# Patient Record
Sex: Male | Born: 1960
Health system: Southern US, Community
[De-identification: ages and names within clinical notes are randomized; demographics above are authoritative.]

## PROBLEM LIST (undated history)

## (undated) DIAGNOSIS — R7303 Prediabetes: Secondary | ICD-10-CM

## (undated) DIAGNOSIS — E785 Hyperlipidemia, unspecified: Secondary | ICD-10-CM

## (undated) DIAGNOSIS — G8929 Other chronic pain: Secondary | ICD-10-CM

## (undated) DIAGNOSIS — G629 Polyneuropathy, unspecified: Secondary | ICD-10-CM

## (undated) DIAGNOSIS — K219 Gastro-esophageal reflux disease without esophagitis: Secondary | ICD-10-CM

## (undated) DIAGNOSIS — K449 Diaphragmatic hernia without obstruction or gangrene: Secondary | ICD-10-CM

## (undated) DIAGNOSIS — E781 Pure hyperglyceridemia: Secondary | ICD-10-CM

## (undated) DIAGNOSIS — M545 Low back pain, unspecified: Secondary | ICD-10-CM

## (undated) HISTORY — DX: Low back pain, unspecified: M54.50

## (undated) HISTORY — DX: Gastro-esophageal reflux disease without esophagitis: K21.9

## (undated) HISTORY — PX: ELBOW SURGERY: SHX618

## (undated) HISTORY — DX: Prediabetes: R73.03

## (undated) HISTORY — PX: OTHER SURGICAL HISTORY: SHX169

## (undated) HISTORY — DX: Diaphragmatic hernia without obstruction or gangrene: K44.9

## (undated) HISTORY — DX: Polyneuropathy, unspecified: G62.9

## (undated) HISTORY — DX: Hyperlipidemia, unspecified: E78.5

## (undated) HISTORY — DX: Pure hyperglyceridemia: E78.1

## (undated) HISTORY — PX: ESOPHAGOGASTRODUODENOSCOPY: SHX1529

## (undated) HISTORY — PX: SHOULDER SURGERY: SHX246

## (undated) HISTORY — DX: Other chronic pain: G89.29

---

## 2000-07-26 ENCOUNTER — Encounter: Payer: Self-pay | Admitting: Orthopedic Surgery

## 2000-07-26 ENCOUNTER — Encounter: Admission: RE | Admit: 2000-07-26 | Discharge: 2000-07-26 | Payer: Self-pay | Admitting: Orthopedic Surgery

## 2008-06-11 ENCOUNTER — Ambulatory Visit (HOSPITAL_COMMUNITY): Admission: RE | Admit: 2008-06-11 | Discharge: 2008-06-12 | Payer: Self-pay | Admitting: Specialist

## 2009-05-10 ENCOUNTER — Inpatient Hospital Stay (HOSPITAL_COMMUNITY): Admission: RE | Admit: 2009-05-10 | Discharge: 2009-05-12 | Payer: Self-pay | Admitting: Neurological Surgery

## 2009-06-09 ENCOUNTER — Encounter (INDEPENDENT_AMBULATORY_CARE_PROVIDER_SITE_OTHER): Payer: Self-pay | Admitting: Neurological Surgery

## 2009-06-09 ENCOUNTER — Ambulatory Visit: Payer: Self-pay | Admitting: Vascular Surgery

## 2009-06-09 ENCOUNTER — Ambulatory Visit: Admission: RE | Admit: 2009-06-09 | Discharge: 2009-06-09 | Payer: Self-pay | Admitting: Neurological Surgery

## 2009-08-06 ENCOUNTER — Ambulatory Visit (HOSPITAL_COMMUNITY): Admission: RE | Admit: 2009-08-06 | Discharge: 2009-08-06 | Payer: Self-pay | Admitting: Neurological Surgery

## 2010-02-22 ENCOUNTER — Encounter: Admission: RE | Admit: 2010-02-22 | Discharge: 2010-02-22 | Payer: Self-pay | Admitting: Orthopaedic Surgery

## 2010-06-16 LAB — CBC
MCHC: 35.6 g/dL (ref 30.0–36.0)
Platelets: 251 10*3/uL (ref 150–400)
RBC: 4.93 MIL/uL (ref 4.22–5.81)
WBC: 6.6 10*3/uL (ref 4.0–10.5)

## 2010-06-16 LAB — TYPE AND SCREEN

## 2010-06-16 LAB — BASIC METABOLIC PANEL
CO2: 27 mEq/L (ref 19–32)
Calcium: 9.9 mg/dL (ref 8.4–10.5)
Creatinine, Ser: 0.86 mg/dL (ref 0.4–1.5)
Glucose, Bld: 127 mg/dL — ABNORMAL HIGH (ref 70–99)

## 2010-06-16 LAB — ABO/RH: ABO/RH(D): B POS

## 2010-07-07 LAB — COMPREHENSIVE METABOLIC PANEL
ALT: 44 U/L (ref 0–53)
AST: 32 U/L (ref 0–37)
Albumin: 4.2 g/dL (ref 3.5–5.2)
Alkaline Phosphatase: 53 U/L (ref 39–117)
BUN: 13 mg/dL (ref 6–23)
GFR calc Af Amer: >60 mL/min (ref >60)
GFR calc non Af Amer: >60 mL/min (ref >60)
Glucose, Bld: 112 mg/dL — ABNORMAL HIGH (ref 70–99)
Potassium: 4.9 mEq/L (ref 3.5–5.1)
Sodium: 144 mEq/L (ref 135–145)
Total Bilirubin: 0.9 mg/dL (ref 0.3–1.2)
Total Protein: 7.1 g/dL (ref 6.0–8.3)

## 2010-07-07 LAB — CBC
MCHC: 34.3 g/dL (ref 30.0–36.0)
RBC: 5.02 MIL/uL (ref 4.22–5.81)
RDW: 12.6 % (ref 11.5–15.5)
WBC: 7.2 10*3/uL (ref 4.0–10.5)

## 2010-07-07 LAB — URINALYSIS, ROUTINE W REFLEX MICROSCOPIC
Hgb urine dipstick: NEGATIVE
Specific Gravity, Urine: 1.021 (ref 1.005–1.030)
pH: 7.5 (ref 5.0–8.0)

## 2010-07-07 LAB — URINE MICROSCOPIC-ADD ON

## 2010-08-09 NOTE — Op Note (Signed)
Brian Doyle, Brian Doyle                ACCOUNT NO.:  [id number]   MEDICAL RECORD NO.:  [id number]          PATIENT TYPE:  AMB   LOCATION:  DAY                          FACILITY:  [facility]   PHYSICIAN:  Jene Every, M.D.    DATE OF BIRTH:  [birth year]   DATE OF PROCEDURE:  06/11/2008  DATE OF DISCHARGE:                               OPERATIVE REPORT   PREOPERATIVE DIAGNOSES:  Spinal stenosis with herniated nucleus pulposus  L4-5 left.   POSTOPERATIVE DIAGNOSES:  Spinal stenosis with herniated nucleus  pulposus L4-5 left.   PROCEDURE PERFORMED:  1. Lateral recess decompression L4-5.  2. Foraminotomy of L5.  3. Microdiskectomy L4-5.   ANESTHESIA:  General.   ASSISTANT:  Roma Schanz, P.A.   BRIEF HISTORY:  A 50 year old with left lower extremity radicular pain  in the L5 nerve root distribution, lateral recess stenosis, positive  neurotension signs, temporary relief with corticosteroid injection.  MRI  indicating paracentral disk protrusion, lateral recess stenosis, disk  degeneration noted as well, predominant leg pain is indicated for  decompression of the 5 root.  The risks and benefits discussed,  including infection, damage to neurovascular structures, CSF leakage,  epidural fibrosis __________need for fusion in the future and anesthetic  complications.   DESCRIPTION OF PROCEDURE:  The patient was placed in the supine position  after induction of adequate anesthesia and 1 gram of Kefzol, placed  prone on the [place] frame.  All bony prominences were well padded.  The  lumbar region prepped and draped in the usual sterile fashion.  Two 18  gauge spinal needles were utilized to localize the 4-5 interspace and  confirmed with x-ray.  Incision was made from the spinous process of 4-  5.  Subcutaneous tissue was dissected.  Electrocautery was utilized to  achieve hemostasis.  A McCullough retractor was placed.  The operating  microscope was draped and brought in the surgical  field.  Confirmatory  radiograph obtained with a Penfield IV in the interlaminar space.  Ligamentum flavum detached from the cephalad edge of 5 with a straight  curette.  Foraminotomy of 5 was performed.  Severe lateral recess  stenosis was noted.  I gently mobilized the 5 root medially and  decompressed the lateral recess to the medial border of the pedicle.  Multifactorial ligamentum flavum hypertrophy.  Epidural venous plexus  was noted.  Electrocautery was utilized to achieve hemostasis.  Focal  HNP was noted.  Annulotomy performed.  A copious portion of disk  material was removed from the disk space with straight and upbiting  pituitary and further mobilized from the subannular space with a nerve  hook.  Following complete removal of herniated material we explored.  There was at least 1 cm of  __________ in the 5 root near the pedicle  without tension.  Hockey-stick probe passed freely out the foramen of 4-  5.  There was no disk herniation beneath the thecal sac, axilla or the  shoulder or the root.  The disk space was copiously irrigated with  antibiotic irrigation.  Thorough inspection revealed no evidence of CSF  leakage  or active bleeding.  Epidural fat was draped over the 5 nerve  root.  Next, McCullough retractors removed, irrigated the muscles with  no active bleeding.  The dorsolumbar fascia reapproximated with 1 Vicryl  interrupted figure-of-eight sutures and subcu with 2-0 Vicryl simple  sutures.  The skin was reapproximated with 4-0 subcuticular Prolene.  The wound reinforced with Steri-  Strips.  Sterile dressing applied.  Placed supine on hospital bed,  extubated without difficulty and transported to the recovery room in  satisfactory condition.   The patient tolerated the procedure well with no complications.      Jene Every, M.D.  Electronically Signed     JB/MEDQ  D:  06/11/2008  T:  06/11/2008  Job:  [id number]

## 2011-08-21 IMAGING — CT CT L SPINE W/O CM
4 of 10 series · 12 of 33 positions shown, 14 images · non-contrast
Comparison: CT of the lumbar spine without contrast 08/06/2009.

CLINICAL DATA: Low back and right leg pain.  Status post fusion.

CT LUMBAR SPINE WITHOUT CONTRAST
TECHNIQUE: Multidetector CT imaging of the lumbar spine was
performed without intravenous contrast administration. Multiplanar
CT image reconstructions were also generated.

[Series 2: l spine bone · axial · 0.27mm/px · z∈[+50,+130]mm · 2 of 97 slices shown, 3 images]
[im 33/97  soft-tissue]
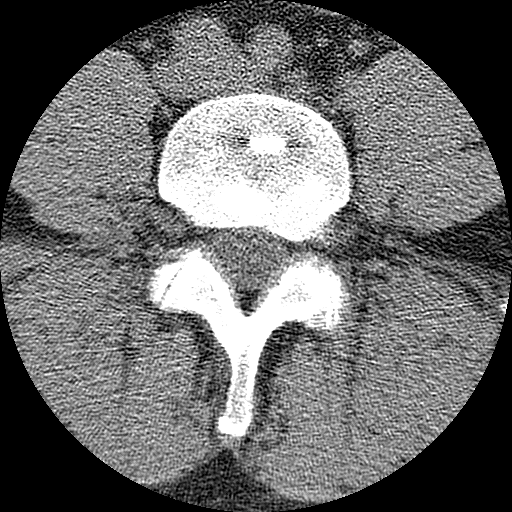
[im 33/97  bone]
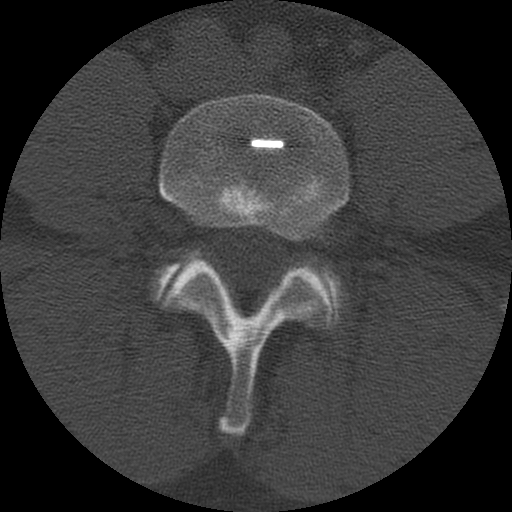
[im 65/97  bone]
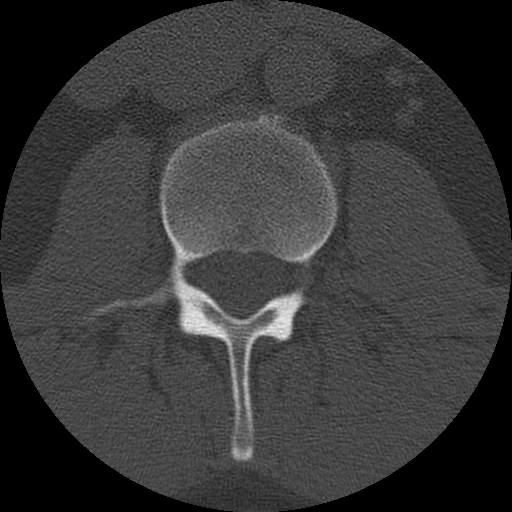

[Series 3: l spine soft · axial · 0.27mm/px · z∈[+50,+130]mm · 2 of 97 slices shown]
[im 33/97  soft-tissue]
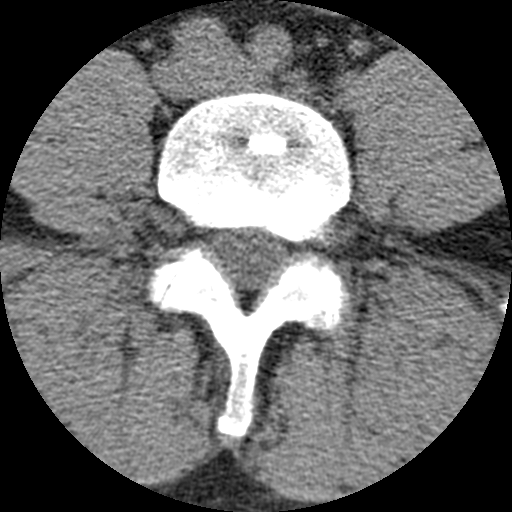
[im 65/97  soft-tissue]
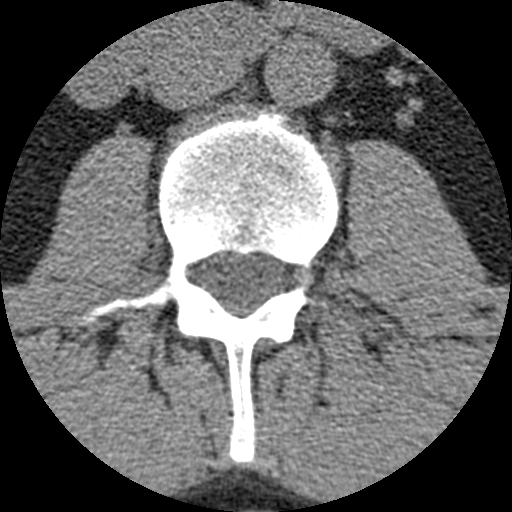

[Series 400: cor · coronal · 0.48mm/px · 3 of 50 slices shown]
[im 10/50  bone]
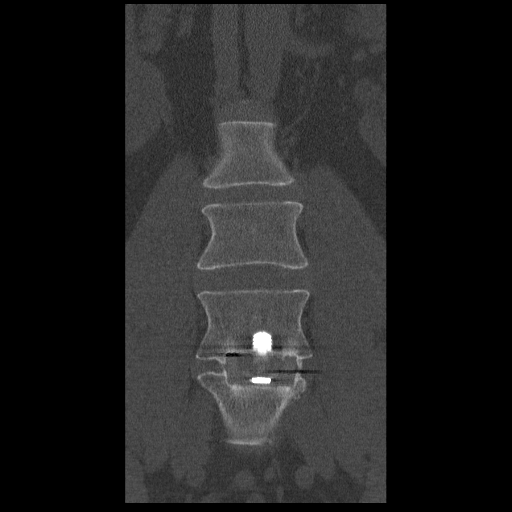
[im 20/50  bone]
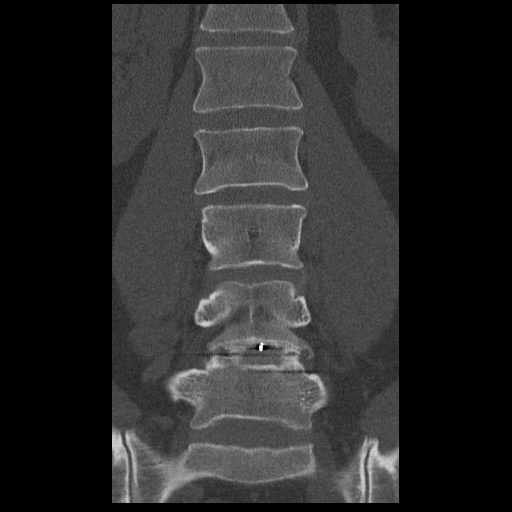
[im 30/50  bone]
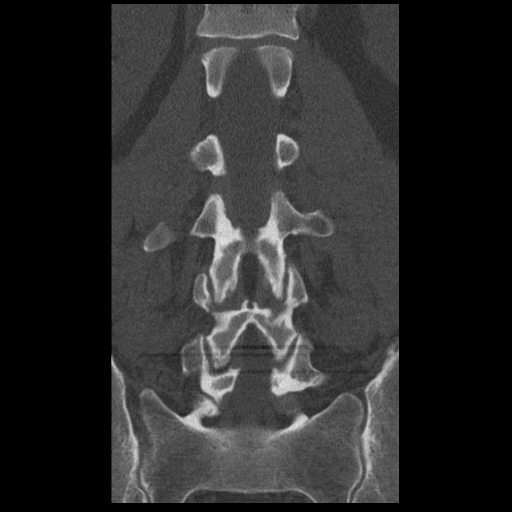

[Series 401: sag · sagittal · 0.48mm/px · 5 of 50 slices shown, 6 images]
[im 17/50  bone]
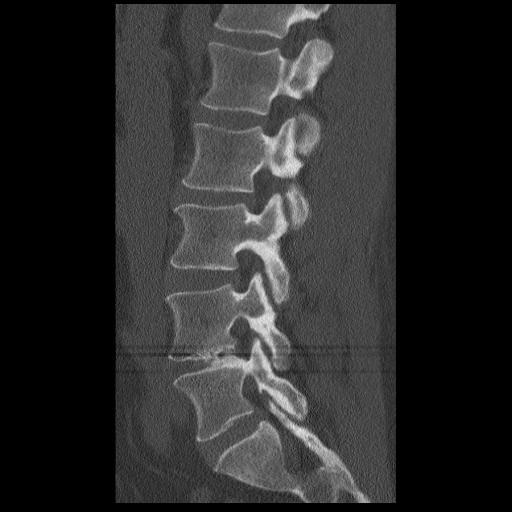
[im 21/50  bone]
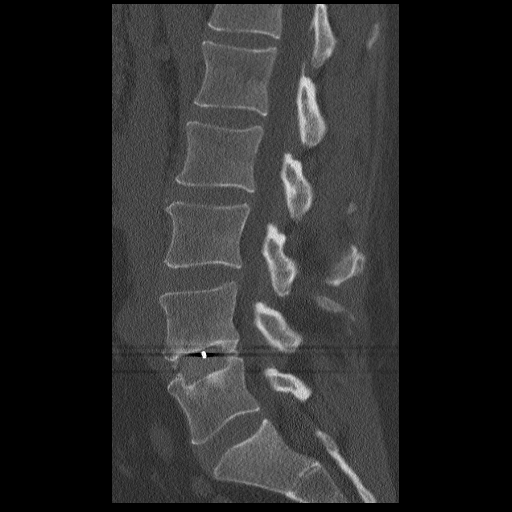
[im 25/50  soft-tissue]
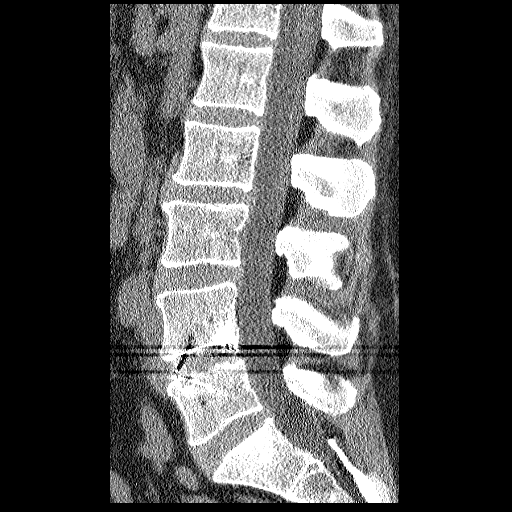
[im 25/50  bone]
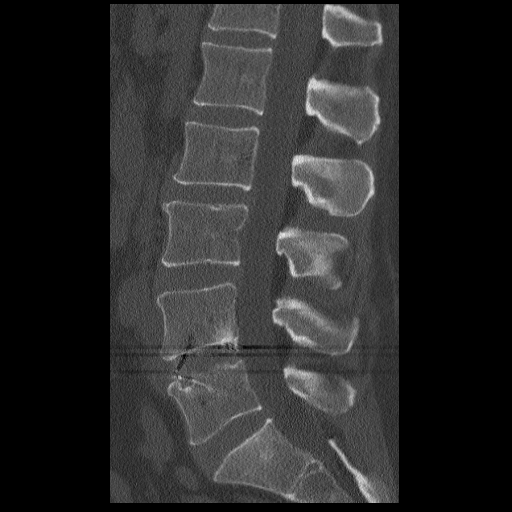
[im 29/50  bone]
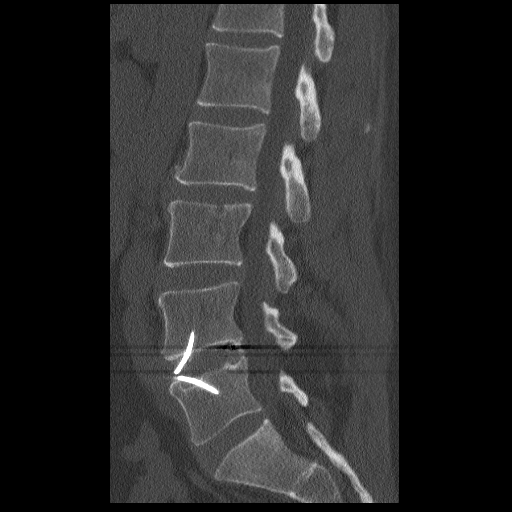
[im 33/50  bone]
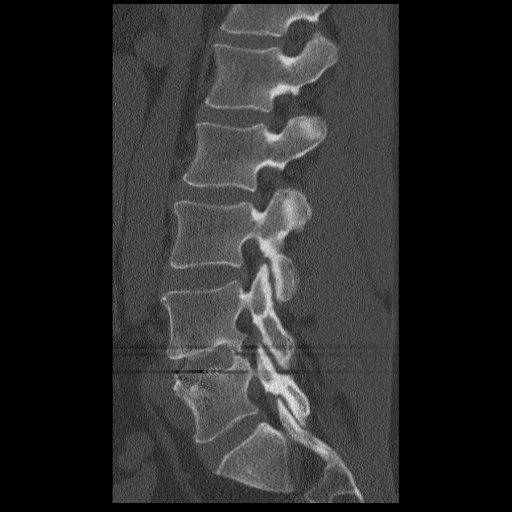

[12 of 33 positions shown; findings below may reference images not displayed]

FINDINGS: The lumbar spine is imaged from midbody of T12 through
S2.  The patient is status post anterior fusion at L4-5.  There is
progressive bone growth since the prior study.  There is some beam
hardening artifact.  However, there does appear to be at least one
area of bridging bone posteriorly on the right.  There is no gas in
the disc space.  The hardware is intact.  Mild disc bulging at L3-4
and L5-S1 is stable.  Mild facet hypertrophy at L5-S1 is unchanged.
IMPRESSION: 1.  Progressive bone growth at the L4-5 disc level following
anterior fusion.
2.  There may be at least one area of focal bridging bone
posteriorly on the right.  No other definite bridging bone is seen
at this point.
3.  Stable mild disc bulging at L3-4 and L5-S1.

## 2013-10-16 HISTORY — PX: COLONOSCOPY: SHX174

## 2014-04-01 DIAGNOSIS — M5442 Lumbago with sciatica, left side: Secondary | ICD-10-CM | POA: Diagnosis not present

## 2014-04-03 DIAGNOSIS — M5442 Lumbago with sciatica, left side: Secondary | ICD-10-CM | POA: Diagnosis not present

## 2014-04-06 DIAGNOSIS — M5442 Lumbago with sciatica, left side: Secondary | ICD-10-CM | POA: Diagnosis not present

## 2014-04-10 DIAGNOSIS — M5442 Lumbago with sciatica, left side: Secondary | ICD-10-CM | POA: Diagnosis not present

## 2014-04-14 DIAGNOSIS — M5442 Lumbago with sciatica, left side: Secondary | ICD-10-CM | POA: Diagnosis not present

## 2014-04-24 DIAGNOSIS — M5442 Lumbago with sciatica, left side: Secondary | ICD-10-CM | POA: Diagnosis not present

## 2014-04-29 DIAGNOSIS — M5442 Lumbago with sciatica, left side: Secondary | ICD-10-CM | POA: Diagnosis not present

## 2014-05-01 DIAGNOSIS — M5442 Lumbago with sciatica, left side: Secondary | ICD-10-CM | POA: Diagnosis not present

## 2014-05-12 DIAGNOSIS — M7712 Lateral epicondylitis, left elbow: Secondary | ICD-10-CM | POA: Diagnosis not present

## 2014-05-21 DIAGNOSIS — E780 Pure hypercholesterolemia: Secondary | ICD-10-CM | POA: Diagnosis not present

## 2014-05-21 DIAGNOSIS — K219 Gastro-esophageal reflux disease without esophagitis: Secondary | ICD-10-CM | POA: Diagnosis not present

## 2014-05-22 DIAGNOSIS — E780 Pure hypercholesterolemia: Secondary | ICD-10-CM | POA: Diagnosis not present

## 2014-06-01 DIAGNOSIS — E780 Pure hypercholesterolemia: Secondary | ICD-10-CM | POA: Diagnosis not present

## 2014-06-01 DIAGNOSIS — K219 Gastro-esophageal reflux disease without esophagitis: Secondary | ICD-10-CM | POA: Diagnosis not present

## 2014-06-01 DIAGNOSIS — M545 Low back pain: Secondary | ICD-10-CM | POA: Diagnosis not present

## 2014-06-05 DIAGNOSIS — M7712 Lateral epicondylitis, left elbow: Secondary | ICD-10-CM | POA: Diagnosis not present

## 2014-06-05 DIAGNOSIS — N289 Disorder of kidney and ureter, unspecified: Secondary | ICD-10-CM | POA: Diagnosis not present

## 2014-06-05 DIAGNOSIS — Z885 Allergy status to narcotic agent status: Secondary | ICD-10-CM | POA: Diagnosis not present

## 2014-06-05 DIAGNOSIS — M549 Dorsalgia, unspecified: Secondary | ICD-10-CM | POA: Diagnosis not present

## 2014-06-05 DIAGNOSIS — Z9852 Vasectomy status: Secondary | ICD-10-CM | POA: Diagnosis not present

## 2014-06-05 DIAGNOSIS — E669 Obesity, unspecified: Secondary | ICD-10-CM | POA: Diagnosis not present

## 2014-06-05 DIAGNOSIS — Z6834 Body mass index (BMI) 34.0-34.9, adult: Secondary | ICD-10-CM | POA: Diagnosis not present

## 2014-06-05 DIAGNOSIS — Z888 Allergy status to other drugs, medicaments and biological substances status: Secondary | ICD-10-CM | POA: Diagnosis not present

## 2014-06-05 DIAGNOSIS — K219 Gastro-esophageal reflux disease without esophagitis: Secondary | ICD-10-CM | POA: Diagnosis not present

## 2014-06-05 DIAGNOSIS — Z79899 Other long term (current) drug therapy: Secondary | ICD-10-CM | POA: Diagnosis not present

## 2014-06-05 DIAGNOSIS — Z801 Family history of malignant neoplasm of trachea, bronchus and lung: Secondary | ICD-10-CM | POA: Diagnosis not present

## 2014-09-03 DIAGNOSIS — M5430 Sciatica, unspecified side: Secondary | ICD-10-CM | POA: Diagnosis not present

## 2014-09-03 DIAGNOSIS — K219 Gastro-esophageal reflux disease without esophagitis: Secondary | ICD-10-CM | POA: Diagnosis not present

## 2014-09-03 DIAGNOSIS — E780 Pure hypercholesterolemia: Secondary | ICD-10-CM | POA: Diagnosis not present

## 2014-09-03 DIAGNOSIS — R202 Paresthesia of skin: Secondary | ICD-10-CM | POA: Diagnosis not present

## 2014-09-03 DIAGNOSIS — M545 Low back pain: Secondary | ICD-10-CM | POA: Diagnosis not present

## 2014-09-29 DIAGNOSIS — M7712 Lateral epicondylitis, left elbow: Secondary | ICD-10-CM | POA: Diagnosis not present

## 2014-09-29 DIAGNOSIS — M25512 Pain in left shoulder: Secondary | ICD-10-CM | POA: Diagnosis not present

## 2014-10-26 DIAGNOSIS — M7712 Lateral epicondylitis, left elbow: Secondary | ICD-10-CM | POA: Diagnosis not present

## 2014-10-26 DIAGNOSIS — M7552 Bursitis of left shoulder: Secondary | ICD-10-CM | POA: Diagnosis not present

## 2014-12-01 DIAGNOSIS — E780 Pure hypercholesterolemia: Secondary | ICD-10-CM | POA: Diagnosis not present

## 2014-12-01 DIAGNOSIS — K219 Gastro-esophageal reflux disease without esophagitis: Secondary | ICD-10-CM | POA: Diagnosis not present

## 2014-12-07 DIAGNOSIS — M5430 Sciatica, unspecified side: Secondary | ICD-10-CM | POA: Diagnosis not present

## 2014-12-07 DIAGNOSIS — M545 Low back pain: Secondary | ICD-10-CM | POA: Diagnosis not present

## 2014-12-07 DIAGNOSIS — K219 Gastro-esophageal reflux disease without esophagitis: Secondary | ICD-10-CM | POA: Diagnosis not present

## 2014-12-07 DIAGNOSIS — L918 Other hypertrophic disorders of the skin: Secondary | ICD-10-CM | POA: Diagnosis not present

## 2015-03-05 DIAGNOSIS — M545 Low back pain: Secondary | ICD-10-CM | POA: Diagnosis not present

## 2015-03-05 DIAGNOSIS — Z13220 Encounter for screening for lipoid disorders: Secondary | ICD-10-CM | POA: Diagnosis not present

## 2015-03-05 DIAGNOSIS — R202 Paresthesia of skin: Secondary | ICD-10-CM | POA: Diagnosis not present

## 2015-03-05 DIAGNOSIS — M5430 Sciatica, unspecified side: Secondary | ICD-10-CM | POA: Diagnosis not present

## 2015-03-05 DIAGNOSIS — K219 Gastro-esophageal reflux disease without esophagitis: Secondary | ICD-10-CM | POA: Diagnosis not present

## 2015-06-13 DIAGNOSIS — N201 Calculus of ureter: Secondary | ICD-10-CM | POA: Diagnosis not present

## 2015-06-13 DIAGNOSIS — K5790 Diverticulosis of intestine, part unspecified, without perforation or abscess without bleeding: Secondary | ICD-10-CM | POA: Diagnosis not present

## 2015-06-13 DIAGNOSIS — Z79899 Other long term (current) drug therapy: Secondary | ICD-10-CM | POA: Diagnosis not present

## 2015-06-13 DIAGNOSIS — R1032 Left lower quadrant pain: Secondary | ICD-10-CM | POA: Diagnosis not present

## 2015-06-13 DIAGNOSIS — M545 Low back pain: Secondary | ICD-10-CM | POA: Diagnosis not present

## 2015-06-13 DIAGNOSIS — K760 Fatty (change of) liver, not elsewhere classified: Secondary | ICD-10-CM | POA: Diagnosis not present

## 2015-06-13 DIAGNOSIS — K769 Liver disease, unspecified: Secondary | ICD-10-CM | POA: Diagnosis not present

## 2015-06-13 DIAGNOSIS — E785 Hyperlipidemia, unspecified: Secondary | ICD-10-CM | POA: Diagnosis not present

## 2015-06-13 DIAGNOSIS — Z87442 Personal history of urinary calculi: Secondary | ICD-10-CM | POA: Diagnosis not present

## 2015-06-13 DIAGNOSIS — K219 Gastro-esophageal reflux disease without esophagitis: Secondary | ICD-10-CM | POA: Diagnosis not present

## 2015-06-13 DIAGNOSIS — N132 Hydronephrosis with renal and ureteral calculous obstruction: Secondary | ICD-10-CM | POA: Diagnosis not present

## 2015-06-21 DIAGNOSIS — E7800 Pure hypercholesterolemia, unspecified: Secondary | ICD-10-CM | POA: Diagnosis not present

## 2015-06-21 DIAGNOSIS — K219 Gastro-esophageal reflux disease without esophagitis: Secondary | ICD-10-CM | POA: Diagnosis not present

## 2015-06-21 DIAGNOSIS — N200 Calculus of kidney: Secondary | ICD-10-CM | POA: Diagnosis not present

## 2015-06-21 DIAGNOSIS — L918 Other hypertrophic disorders of the skin: Secondary | ICD-10-CM | POA: Diagnosis not present

## 2015-06-21 DIAGNOSIS — Z72820 Sleep deprivation: Secondary | ICD-10-CM | POA: Diagnosis not present

## 2015-06-21 DIAGNOSIS — R202 Paresthesia of skin: Secondary | ICD-10-CM | POA: Diagnosis not present

## 2015-06-21 DIAGNOSIS — F1190 Opioid use, unspecified, uncomplicated: Secondary | ICD-10-CM | POA: Diagnosis not present

## 2015-06-21 DIAGNOSIS — M5432 Sciatica, left side: Secondary | ICD-10-CM | POA: Diagnosis not present

## 2015-06-21 DIAGNOSIS — N4281 Prostatodynia syndrome: Secondary | ICD-10-CM | POA: Diagnosis not present

## 2015-06-23 DIAGNOSIS — K219 Gastro-esophageal reflux disease without esophagitis: Secondary | ICD-10-CM | POA: Diagnosis not present

## 2015-06-23 DIAGNOSIS — N200 Calculus of kidney: Secondary | ICD-10-CM | POA: Diagnosis not present

## 2015-06-23 DIAGNOSIS — M545 Low back pain: Secondary | ICD-10-CM | POA: Diagnosis not present

## 2015-06-23 DIAGNOSIS — Z13220 Encounter for screening for lipoid disorders: Secondary | ICD-10-CM | POA: Diagnosis not present

## 2015-06-23 DIAGNOSIS — M5432 Sciatica, left side: Secondary | ICD-10-CM | POA: Diagnosis not present

## 2015-07-02 DIAGNOSIS — K760 Fatty (change of) liver, not elsewhere classified: Secondary | ICD-10-CM | POA: Diagnosis not present

## 2015-07-02 DIAGNOSIS — N281 Cyst of kidney, acquired: Secondary | ICD-10-CM | POA: Diagnosis not present

## 2015-07-02 DIAGNOSIS — D376 Neoplasm of uncertain behavior of liver, gallbladder and bile ducts: Secondary | ICD-10-CM | POA: Diagnosis not present

## 2015-08-10 DIAGNOSIS — R8299 Other abnormal findings in urine: Secondary | ICD-10-CM | POA: Diagnosis not present

## 2015-08-10 DIAGNOSIS — N200 Calculus of kidney: Secondary | ICD-10-CM | POA: Diagnosis not present

## 2015-09-17 DIAGNOSIS — Z1389 Encounter for screening for other disorder: Secondary | ICD-10-CM | POA: Diagnosis not present

## 2015-09-17 DIAGNOSIS — K219 Gastro-esophageal reflux disease without esophagitis: Secondary | ICD-10-CM | POA: Diagnosis not present

## 2015-09-17 DIAGNOSIS — E7800 Pure hypercholesterolemia, unspecified: Secondary | ICD-10-CM | POA: Diagnosis not present

## 2015-09-17 DIAGNOSIS — M545 Low back pain: Secondary | ICD-10-CM | POA: Diagnosis not present

## 2015-09-17 DIAGNOSIS — R202 Paresthesia of skin: Secondary | ICD-10-CM | POA: Diagnosis not present

## 2015-09-17 DIAGNOSIS — M5432 Sciatica, left side: Secondary | ICD-10-CM | POA: Diagnosis not present

## 2015-11-25 DIAGNOSIS — Z0000 Encounter for general adult medical examination without abnormal findings: Secondary | ICD-10-CM | POA: Diagnosis not present

## 2015-11-25 DIAGNOSIS — E7800 Pure hypercholesterolemia, unspecified: Secondary | ICD-10-CM | POA: Diagnosis not present

## 2015-11-25 DIAGNOSIS — K219 Gastro-esophageal reflux disease without esophagitis: Secondary | ICD-10-CM | POA: Diagnosis not present

## 2016-01-08 DIAGNOSIS — Z0001 Encounter for general adult medical examination with abnormal findings: Secondary | ICD-10-CM | POA: Diagnosis not present

## 2016-01-08 DIAGNOSIS — M5432 Sciatica, left side: Secondary | ICD-10-CM | POA: Diagnosis not present

## 2016-01-19 DIAGNOSIS — M545 Low back pain: Secondary | ICD-10-CM | POA: Diagnosis not present

## 2016-02-14 DIAGNOSIS — M79672 Pain in left foot: Secondary | ICD-10-CM | POA: Diagnosis not present

## 2016-02-14 DIAGNOSIS — G8929 Other chronic pain: Secondary | ICD-10-CM | POA: Diagnosis not present

## 2016-02-14 DIAGNOSIS — Z79899 Other long term (current) drug therapy: Secondary | ICD-10-CM | POA: Diagnosis not present

## 2016-02-29 DIAGNOSIS — M47816 Spondylosis without myelopathy or radiculopathy, lumbar region: Secondary | ICD-10-CM | POA: Diagnosis not present

## 2016-02-29 DIAGNOSIS — Z981 Arthrodesis status: Secondary | ICD-10-CM | POA: Diagnosis not present

## 2016-03-08 DIAGNOSIS — K645 Perianal venous thrombosis: Secondary | ICD-10-CM | POA: Diagnosis not present

## 2016-03-13 DIAGNOSIS — M5136 Other intervertebral disc degeneration, lumbar region: Secondary | ICD-10-CM | POA: Diagnosis not present

## 2016-03-13 DIAGNOSIS — Z79899 Other long term (current) drug therapy: Secondary | ICD-10-CM | POA: Diagnosis not present

## 2016-03-13 DIAGNOSIS — G8929 Other chronic pain: Secondary | ICD-10-CM | POA: Diagnosis not present

## 2016-03-13 DIAGNOSIS — M47816 Spondylosis without myelopathy or radiculopathy, lumbar region: Secondary | ICD-10-CM | POA: Diagnosis not present

## 2016-04-04 DIAGNOSIS — Z72820 Sleep deprivation: Secondary | ICD-10-CM | POA: Diagnosis not present

## 2016-04-04 DIAGNOSIS — K219 Gastro-esophageal reflux disease without esophagitis: Secondary | ICD-10-CM | POA: Diagnosis not present

## 2016-04-04 DIAGNOSIS — M5430 Sciatica, unspecified side: Secondary | ICD-10-CM | POA: Diagnosis not present

## 2016-04-04 DIAGNOSIS — M545 Low back pain: Secondary | ICD-10-CM | POA: Diagnosis not present

## 2016-05-30 DIAGNOSIS — M961 Postlaminectomy syndrome, not elsewhere classified: Secondary | ICD-10-CM | POA: Diagnosis not present

## 2016-08-29 DIAGNOSIS — G894 Chronic pain syndrome: Secondary | ICD-10-CM | POA: Diagnosis not present

## 2016-08-29 DIAGNOSIS — M961 Postlaminectomy syndrome, not elsewhere classified: Secondary | ICD-10-CM | POA: Diagnosis not present

## 2016-11-09 DIAGNOSIS — L918 Other hypertrophic disorders of the skin: Secondary | ICD-10-CM | POA: Diagnosis not present

## 2016-11-09 DIAGNOSIS — E7800 Pure hypercholesterolemia, unspecified: Secondary | ICD-10-CM | POA: Diagnosis not present

## 2016-11-09 DIAGNOSIS — K219 Gastro-esophageal reflux disease without esophagitis: Secondary | ICD-10-CM | POA: Diagnosis not present

## 2016-11-14 DIAGNOSIS — M5430 Sciatica, unspecified side: Secondary | ICD-10-CM | POA: Diagnosis not present

## 2016-11-14 DIAGNOSIS — Z1389 Encounter for screening for other disorder: Secondary | ICD-10-CM | POA: Diagnosis not present

## 2016-11-14 DIAGNOSIS — M545 Low back pain: Secondary | ICD-10-CM | POA: Diagnosis not present

## 2016-11-14 DIAGNOSIS — Z72820 Sleep deprivation: Secondary | ICD-10-CM | POA: Diagnosis not present

## 2016-11-28 DIAGNOSIS — M961 Postlaminectomy syndrome, not elsewhere classified: Secondary | ICD-10-CM | POA: Diagnosis not present

## 2016-11-28 DIAGNOSIS — M542 Cervicalgia: Secondary | ICD-10-CM | POA: Diagnosis not present

## 2016-11-28 DIAGNOSIS — M5413 Radiculopathy, cervicothoracic region: Secondary | ICD-10-CM | POA: Diagnosis not present

## 2017-01-04 DIAGNOSIS — M542 Cervicalgia: Secondary | ICD-10-CM | POA: Diagnosis not present

## 2017-01-04 DIAGNOSIS — M5413 Radiculopathy, cervicothoracic region: Secondary | ICD-10-CM | POA: Diagnosis not present

## 2017-01-15 DIAGNOSIS — M542 Cervicalgia: Secondary | ICD-10-CM | POA: Diagnosis not present

## 2017-01-15 DIAGNOSIS — M5413 Radiculopathy, cervicothoracic region: Secondary | ICD-10-CM | POA: Diagnosis not present

## 2017-02-27 DIAGNOSIS — M961 Postlaminectomy syndrome, not elsewhere classified: Secondary | ICD-10-CM | POA: Diagnosis not present

## 2017-05-09 DIAGNOSIS — E7800 Pure hypercholesterolemia, unspecified: Secondary | ICD-10-CM | POA: Diagnosis not present

## 2017-05-16 DIAGNOSIS — E7800 Pure hypercholesterolemia, unspecified: Secondary | ICD-10-CM | POA: Diagnosis not present

## 2017-05-16 DIAGNOSIS — Z0001 Encounter for general adult medical examination with abnormal findings: Secondary | ICD-10-CM | POA: Diagnosis not present

## 2017-05-16 DIAGNOSIS — M545 Low back pain: Secondary | ICD-10-CM | POA: Diagnosis not present

## 2017-05-16 DIAGNOSIS — Z72820 Sleep deprivation: Secondary | ICD-10-CM | POA: Diagnosis not present

## 2017-05-16 DIAGNOSIS — K219 Gastro-esophageal reflux disease without esophagitis: Secondary | ICD-10-CM | POA: Diagnosis not present

## 2017-05-28 DIAGNOSIS — M5413 Radiculopathy, cervicothoracic region: Secondary | ICD-10-CM | POA: Diagnosis not present

## 2017-05-28 DIAGNOSIS — R030 Elevated blood-pressure reading, without diagnosis of hypertension: Secondary | ICD-10-CM | POA: Diagnosis not present

## 2017-05-28 DIAGNOSIS — M961 Postlaminectomy syndrome, not elsewhere classified: Secondary | ICD-10-CM | POA: Diagnosis not present

## 2017-05-28 DIAGNOSIS — M542 Cervicalgia: Secondary | ICD-10-CM | POA: Diagnosis not present

## 2017-08-27 DIAGNOSIS — M542 Cervicalgia: Secondary | ICD-10-CM | POA: Diagnosis not present

## 2017-08-27 DIAGNOSIS — R030 Elevated blood-pressure reading, without diagnosis of hypertension: Secondary | ICD-10-CM | POA: Diagnosis not present

## 2017-08-27 DIAGNOSIS — M961 Postlaminectomy syndrome, not elsewhere classified: Secondary | ICD-10-CM | POA: Diagnosis not present

## 2017-09-13 DIAGNOSIS — L918 Other hypertrophic disorders of the skin: Secondary | ICD-10-CM | POA: Diagnosis not present

## 2017-09-13 DIAGNOSIS — K219 Gastro-esophageal reflux disease without esophagitis: Secondary | ICD-10-CM | POA: Diagnosis not present

## 2017-09-13 DIAGNOSIS — E7800 Pure hypercholesterolemia, unspecified: Secondary | ICD-10-CM | POA: Diagnosis not present

## 2017-09-19 DIAGNOSIS — K219 Gastro-esophageal reflux disease without esophagitis: Secondary | ICD-10-CM | POA: Diagnosis not present

## 2017-09-19 DIAGNOSIS — M545 Low back pain: Secondary | ICD-10-CM | POA: Diagnosis not present

## 2017-09-19 DIAGNOSIS — E782 Mixed hyperlipidemia: Secondary | ICD-10-CM | POA: Diagnosis not present

## 2017-09-19 DIAGNOSIS — Z72820 Sleep deprivation: Secondary | ICD-10-CM | POA: Diagnosis not present

## 2017-11-22 DIAGNOSIS — R030 Elevated blood-pressure reading, without diagnosis of hypertension: Secondary | ICD-10-CM | POA: Diagnosis not present

## 2017-11-22 DIAGNOSIS — M961 Postlaminectomy syndrome, not elsewhere classified: Secondary | ICD-10-CM | POA: Diagnosis not present

## 2017-11-22 DIAGNOSIS — M542 Cervicalgia: Secondary | ICD-10-CM | POA: Diagnosis not present

## 2017-11-22 DIAGNOSIS — M5413 Radiculopathy, cervicothoracic region: Secondary | ICD-10-CM | POA: Diagnosis not present

## 2017-12-26 DIAGNOSIS — Z79899 Other long term (current) drug therapy: Secondary | ICD-10-CM | POA: Diagnosis not present

## 2017-12-26 DIAGNOSIS — Z888 Allergy status to other drugs, medicaments and biological substances status: Secondary | ICD-10-CM | POA: Diagnosis not present

## 2017-12-26 DIAGNOSIS — N200 Calculus of kidney: Secondary | ICD-10-CM | POA: Diagnosis not present

## 2017-12-26 DIAGNOSIS — K5730 Diverticulosis of large intestine without perforation or abscess without bleeding: Secondary | ICD-10-CM | POA: Diagnosis not present

## 2017-12-26 DIAGNOSIS — N132 Hydronephrosis with renal and ureteral calculous obstruction: Secondary | ICD-10-CM | POA: Diagnosis not present

## 2017-12-26 DIAGNOSIS — K7689 Other specified diseases of liver: Secondary | ICD-10-CM | POA: Diagnosis not present

## 2017-12-26 DIAGNOSIS — E785 Hyperlipidemia, unspecified: Secondary | ICD-10-CM | POA: Diagnosis not present

## 2017-12-26 DIAGNOSIS — G8929 Other chronic pain: Secondary | ICD-10-CM | POA: Diagnosis not present

## 2017-12-26 DIAGNOSIS — K219 Gastro-esophageal reflux disease without esophagitis: Secondary | ICD-10-CM | POA: Diagnosis not present

## 2017-12-26 DIAGNOSIS — Z885 Allergy status to narcotic agent status: Secondary | ICD-10-CM | POA: Diagnosis not present

## 2018-02-26 DIAGNOSIS — M961 Postlaminectomy syndrome, not elsewhere classified: Secondary | ICD-10-CM | POA: Diagnosis not present

## 2018-02-26 DIAGNOSIS — R030 Elevated blood-pressure reading, without diagnosis of hypertension: Secondary | ICD-10-CM | POA: Diagnosis not present

## 2018-02-26 DIAGNOSIS — M5413 Radiculopathy, cervicothoracic region: Secondary | ICD-10-CM | POA: Diagnosis not present

## 2018-02-26 DIAGNOSIS — M542 Cervicalgia: Secondary | ICD-10-CM | POA: Diagnosis not present

## 2018-04-01 DIAGNOSIS — E782 Mixed hyperlipidemia: Secondary | ICD-10-CM | POA: Diagnosis not present

## 2018-04-01 DIAGNOSIS — K219 Gastro-esophageal reflux disease without esophagitis: Secondary | ICD-10-CM | POA: Diagnosis not present

## 2018-04-01 DIAGNOSIS — E7800 Pure hypercholesterolemia, unspecified: Secondary | ICD-10-CM | POA: Diagnosis not present

## 2018-04-09 DIAGNOSIS — M545 Low back pain: Secondary | ICD-10-CM | POA: Diagnosis not present

## 2018-04-09 DIAGNOSIS — K219 Gastro-esophageal reflux disease without esophagitis: Secondary | ICD-10-CM | POA: Diagnosis not present

## 2018-04-09 DIAGNOSIS — Z1389 Encounter for screening for other disorder: Secondary | ICD-10-CM | POA: Diagnosis not present

## 2018-04-09 DIAGNOSIS — E782 Mixed hyperlipidemia: Secondary | ICD-10-CM | POA: Diagnosis not present

## 2018-05-08 DIAGNOSIS — M542 Cervicalgia: Secondary | ICD-10-CM | POA: Diagnosis not present

## 2018-05-08 DIAGNOSIS — M961 Postlaminectomy syndrome, not elsewhere classified: Secondary | ICD-10-CM | POA: Diagnosis not present

## 2018-05-08 DIAGNOSIS — R030 Elevated blood-pressure reading, without diagnosis of hypertension: Secondary | ICD-10-CM | POA: Diagnosis not present

## 2018-05-20 DIAGNOSIS — M545 Low back pain: Secondary | ICD-10-CM | POA: Diagnosis not present

## 2018-06-11 DIAGNOSIS — M5126 Other intervertebral disc displacement, lumbar region: Secondary | ICD-10-CM | POA: Diagnosis not present

## 2018-06-11 DIAGNOSIS — M5127 Other intervertebral disc displacement, lumbosacral region: Secondary | ICD-10-CM | POA: Diagnosis not present

## 2018-06-11 DIAGNOSIS — M4807 Spinal stenosis, lumbosacral region: Secondary | ICD-10-CM | POA: Diagnosis not present

## 2018-06-17 DIAGNOSIS — M5416 Radiculopathy, lumbar region: Secondary | ICD-10-CM | POA: Diagnosis not present

## 2018-07-18 DIAGNOSIS — M5416 Radiculopathy, lumbar region: Secondary | ICD-10-CM | POA: Diagnosis not present

## 2018-07-30 DIAGNOSIS — M5416 Radiculopathy, lumbar region: Secondary | ICD-10-CM | POA: Diagnosis not present

## 2018-08-08 DIAGNOSIS — M5416 Radiculopathy, lumbar region: Secondary | ICD-10-CM | POA: Diagnosis not present

## 2018-08-08 DIAGNOSIS — M5136 Other intervertebral disc degeneration, lumbar region: Secondary | ICD-10-CM | POA: Diagnosis not present

## 2018-08-08 DIAGNOSIS — M961 Postlaminectomy syndrome, not elsewhere classified: Secondary | ICD-10-CM | POA: Diagnosis not present

## 2018-09-09 DIAGNOSIS — M5136 Other intervertebral disc degeneration, lumbar region: Secondary | ICD-10-CM | POA: Diagnosis not present

## 2018-09-09 DIAGNOSIS — M48061 Spinal stenosis, lumbar region without neurogenic claudication: Secondary | ICD-10-CM | POA: Diagnosis not present

## 2018-09-10 DIAGNOSIS — M5136 Other intervertebral disc degeneration, lumbar region: Secondary | ICD-10-CM | POA: Diagnosis not present

## 2018-10-02 DIAGNOSIS — K219 Gastro-esophageal reflux disease without esophagitis: Secondary | ICD-10-CM | POA: Diagnosis not present

## 2018-10-02 DIAGNOSIS — E7800 Pure hypercholesterolemia, unspecified: Secondary | ICD-10-CM | POA: Diagnosis not present

## 2018-10-02 DIAGNOSIS — E782 Mixed hyperlipidemia: Secondary | ICD-10-CM | POA: Diagnosis not present

## 2018-10-11 DIAGNOSIS — K219 Gastro-esophageal reflux disease without esophagitis: Secondary | ICD-10-CM | POA: Diagnosis not present

## 2018-10-11 DIAGNOSIS — E782 Mixed hyperlipidemia: Secondary | ICD-10-CM | POA: Diagnosis not present

## 2018-10-11 DIAGNOSIS — M545 Low back pain: Secondary | ICD-10-CM | POA: Diagnosis not present

## 2018-10-29 DIAGNOSIS — R030 Elevated blood-pressure reading, without diagnosis of hypertension: Secondary | ICD-10-CM | POA: Diagnosis not present

## 2018-10-29 DIAGNOSIS — M961 Postlaminectomy syndrome, not elsewhere classified: Secondary | ICD-10-CM | POA: Diagnosis not present

## 2018-10-29 DIAGNOSIS — M5136 Other intervertebral disc degeneration, lumbar region: Secondary | ICD-10-CM | POA: Diagnosis not present

## 2018-11-04 DIAGNOSIS — K219 Gastro-esophageal reflux disease without esophagitis: Secondary | ICD-10-CM | POA: Diagnosis not present

## 2018-11-11 ENCOUNTER — Encounter: Payer: Self-pay | Admitting: Gastroenterology

## 2018-12-07 NOTE — Progress Notes (Addendum)
REVIEWED-NO ADDITIONAL RECOMMENDATIONS.  Referring Provider: Rory Percy, MD Primary Care Physician:  Rory Percy, MD Primary Gastroenterologist:  Dr. Oneida Alar  Chief Complaint  Patient presents with   Gastroesophageal Reflux    last EGD 7 yrs ago. Has small hernia and it has "spasms" that cause pain in neck    HPI:   Brian Doyle is a 58 y.o. male presenting today at the request of Dr. Nadara Mustard for acid reflux.   Today he states he has long history of GERD. Known hiatal hernia. Diagnosed 7 years ago at time of EGD at [facility].  Around the middle of August he was having daily, intermittent "hiatal hernia spasms" for about 2 weeks straight. Symptoms include sharp pain that starts at his xiphoid process that radiates up through chest and into his neck. Symptoms have been present on and off for several years and this is why the EGD was done in the past. States he has been worked up with EKG with PCP in the past. Has had EKG while spasm was occurring and no changes. If he forces air down into his stomach, the pain will stop immediately.  No identified triggers of the spasms.  Nothing had changed significantly prior to the onset of daily symptoms.  Daily symptoms have resolved. Has only happened twice in the last 2 weeks. Prior to the onset of daily symptoms, the spasms would occur once or twice every month or two.  Admits to breakthrough reflux daily even with taking omeprazole twice daily. Has been on omeprazole for 20 years. Has tried Prevacid and Nexium. States Nexium did really well, but his insurance doesn't cover this well anymore. States he sleeps with his bed propped up. If not, will have reflux at night. Reports he has aspirated several times in the past. Doesn't follow GERD diet. Admits to dysphagia. Feels like food hangs in mid and lower esophagus. Mostly with breads and potatoes. Liquids are fine. No trouble will pills. Occasional brbpr about 1-2 times a year. None recently. Present  when constipated. BMs usually daily. Has been taking probiotic to help. Would go 2+ days without a bowel movement prior. No diarrhea. Last colonoscopy about 3-4 years ago at [facility]. Reports no polyps. Occasional hematochezia also present then. No unintentional weight loss. No melena. No nausea or vomiting. No unintentional weight loss. No other abdominal pain.   Occasional Advil PM. Maybe twice a month. Takes Excedrin migraine daily.   Alcohol about twice a year.   States he is active walking regularly and running around with his grandson.   Past Medical History:  Diagnosis Date   Chronic lower back pain    GERD (gastroesophageal reflux disease)    Hiatal hernia    HLD (hyperlipidemia)    Hypertriglyceridemia    Neuropathy    Pre-diabetes     Past Surgical History:  Procedure Laterality Date   COLONOSCOPY     ELBOW SURGERY Left    ESOPHAGOGASTRODUODENOSCOPY     lower back surgery     x 2 (March 2010; Feb 2011)   SHOULDER SURGERY Left    vasectomy      Current Outpatient Medications  Medication Sig Dispense Refill   aspirin-acetaminophen-caffeine (EXCEDRIN MIGRAINE) 250-250-65 MG tablet Take by mouth every 6 (six) hours as needed for headache.     gabapentin (NEURONTIN) 300 MG capsule Take 900 mg by mouth 3 (three) times daily.     HYDROcodone-acetaminophen (NORCO/VICODIN) 5-325 MG tablet Take 1 tablet by mouth 3 (three) times  daily.     Multiple Vitamin (MULTIVITAMIN) capsule Take 1 capsule by mouth daily.     rosuvastatin (CRESTOR) 5 MG tablet Take 5 mg by mouth daily.     tiZANidine (ZANAFLEX) 4 MG capsule Take 4 mg by mouth 3 (three) times daily.     metFORMIN (GLUCOPHAGE) 500 MG tablet Take 500 mg by mouth daily with breakfast.     pantoprazole (PROTONIX) 40 MG tablet Take 1 tablet (40 mg total) by mouth daily. 90 tablet 3   No current facility-administered medications for this visit.     Allergies as of 12/09/2018 - Review Complete 12/09/2018    Allergen Reaction Noted   Morphine and related  12/09/2018    Family History  Problem Relation Age of Onset   Lung cancer Mother    Colon cancer Neg Hx    Colon polyps Neg Hx     Social History   Socioeconomic History   Marital status: Married    Spouse name: Not on file   Number of children: Not on file   Years of education: Not on file   Highest education level: Not on file  Occupational History   Not on file  Social Needs   Financial resource strain: Not on file   Food insecurity    Worry: Not on file    Inability: Not on file   Transportation needs    Medical: Not on file    Non-medical: Not on file  Tobacco Use   Smoking status: Former Smoker    Types: Cigarettes   Smokeless tobacco: Never Used  Substance and Sexual Activity   Alcohol use: Yes    Comment: occas   Drug use: Never   Sexual activity: Not on file  Lifestyle   Physical activity    Days per week: Not on file    Minutes per session: Not on file   Stress: Not on file  Relationships   Social connections    Talks on phone: Not on file    Gets together: Not on file    Attends religious service: Not on file    Active member of club or organization: Not on file    Attends meetings of clubs or organizations: Not on file    Relationship status: Not on file   Intimate partner violence    Fear of current or ex partner: Not on file    Emotionally abused: Not on file    Physically abused: Not on file    Forced sexual activity: Not on file  Other Topics Concern   Not on file  Social History Narrative   Not on file    Review of Systems: Gen: Denies any fever, chills, lightheadedness, dizziness, presyncope. HEENT: Denies upper respiratory symptoms or flulike symptoms. CV: Denies chest pain, heart palpitations, peripheral edema Resp: Denies shortness of breath or cough. GI: See HPI GU : Denies urinary burning, urinary frequency, urinary hesitancy MS: Chronic low back  pain Derm: Denies rash Psych: Denies depression, anxiety Heme: Denies other bruising or bleeding.  Physical Exam: BP (!) 142/79    Pulse 65    Temp (!) 96.9 F (36.1 C) (Oral)    Ht 6' (1.829 m)    Wt 247 lb 6.4 oz (112.2 kg)    BMI 33.55 kg/m  General:   Alert and oriented. Pleasant and cooperative. Well-nourished and well-developed.  Head:  Normocephalic and atraumatic. Eyes:  Without icterus, sclera clear and conjunctiva pink.  Ears:  Normal auditory acuity. Nose:  No deformity, discharge,  or lesions. Lungs:  Clear to auscultation bilaterally. No wheezes, rales, or rhonchi. No distress.  Heart:  S1, S2 present without murmurs appreciated.  Abdomen:  +BS, soft, non-tender and non-distended. No HSM noted. No guarding or rebound. No masses appreciated.  Rectal:  Deferred  Msk:  Symmetrical without gross deformities. Normal posture. Pulses:  Normal pulses noted.  Extremities:  Without edema. Neurologic:  Alert and  oriented x4;  grossly normal neurologically. Skin:  Intact without significant lesions or rashes. Psych:  Normal mood and affect.   EKG on 11/04/18: sinus bradycardia (HR 58) and possible left atrial enlargement. Signed by Dr. Nadara Mustard indicating no acute change.   Labs 10/02/18: CMP: Glucose 111, creatinine 0.98, sodium 141, potassium 4.8, calcium 9.7, albumin 4.6, total bilirubin 0.4, alk phos 61, AST 20, ALT 25 CBC: WBC 8.2, hemoglobin 15.6, MCV 88, MCH 29.9, platelets 299 Hemoglobin A1c: 6.2 Lipid panel: Total cholesterol 248, triglycerides 292, LDL 141, HDL 58

## 2018-12-09 ENCOUNTER — Ambulatory Visit (INDEPENDENT_AMBULATORY_CARE_PROVIDER_SITE_OTHER): Payer: Medicare Other | Admitting: Gastroenterology

## 2018-12-09 ENCOUNTER — Encounter: Payer: Self-pay | Admitting: *Deleted

## 2018-12-09 ENCOUNTER — Encounter: Payer: Self-pay | Admitting: Gastroenterology

## 2018-12-09 ENCOUNTER — Other Ambulatory Visit: Payer: Self-pay | Admitting: Gastroenterology

## 2018-12-09 ENCOUNTER — Telehealth: Payer: Self-pay | Admitting: *Deleted

## 2018-12-09 ENCOUNTER — Other Ambulatory Visit: Payer: Self-pay | Admitting: *Deleted

## 2018-12-09 ENCOUNTER — Other Ambulatory Visit: Payer: Self-pay

## 2018-12-09 VITALS — BP 142/79 | HR 65 | Temp 96.9°F | Ht 72.0 in | Wt 247.4 lb

## 2018-12-09 DIAGNOSIS — K219 Gastro-esophageal reflux disease without esophagitis: Secondary | ICD-10-CM | POA: Insufficient documentation

## 2018-12-09 DIAGNOSIS — R1310 Dysphagia, unspecified: Secondary | ICD-10-CM | POA: Diagnosis not present

## 2018-12-09 DIAGNOSIS — K449 Diaphragmatic hernia without obstruction or gangrene: Secondary | ICD-10-CM

## 2018-12-09 DIAGNOSIS — K625 Hemorrhage of anus and rectum: Secondary | ICD-10-CM

## 2018-12-09 DIAGNOSIS — G8929 Other chronic pain: Secondary | ICD-10-CM | POA: Insufficient documentation

## 2018-12-09 DIAGNOSIS — R1013 Epigastric pain: Secondary | ICD-10-CM | POA: Diagnosis not present

## 2018-12-09 MED ORDER — PANTOPRAZOLE SODIUM 40 MG PO TBEC: 40.0000 mg | DELAYED_RELEASE_TABLET | Freq: Every day | ORAL | 3 refills | Status: DC

## 2018-12-09 MED ORDER — PANTOPRAZOLE SODIUM 40 MG PO TBEC: 40.0000 mg | DELAYED_RELEASE_TABLET | Freq: Two times a day (BID) | ORAL | 3 refills | Status: DC

## 2018-12-09 NOTE — Patient Instructions (Addendum)
We will get you scheduled for an upper endoscopy with possible dilation in the near future with Dr. Oneida Alar. Medication adjustments for your procedure: Hold metformin the morning of your procedure.  Please stop taking omeprazole and start taking Protonix 40 mg twice a day 30 minutes before breakfast and 30 minutes before dinner.  Please see GERD handout below for recommended dietary adjustments.  We will plan to see you back in the office after your upper endoscopy.  Please call if you have concerns prior.  Aliene Altes, PA-C [facility]  Food Choices for Gastroesophageal Reflux Disease, Adult When you have gastroesophageal reflux disease (GERD), the foods you eat and your eating habits are very important. Choosing the right foods can help ease your discomfort. Think about working with a nutrition specialist (dietitian) to help you make good choices. What are tips for following this plan?  Meals  Choose healthy foods that are low in fat, such as fruits, vegetables, whole grains, low-fat dairy products, and lean meat, fish, and poultry.  Eat small meals often instead of 3 large meals a day. Eat your meals slowly, and in a place where you are relaxed. Avoid bending over or lying down until 2-3 hours after eating.  Avoid eating meals 2-3 hours before bed.  Avoid drinking a lot of liquid with meals.  Cook foods using methods other than frying. Bake, grill, or broil food instead.  Avoid or limit: ? Chocolate. ? Peppermint or spearmint. ? Alcohol. ? Pepper. ? Black and decaffeinated coffee. ? Black and decaffeinated tea. ? Bubbly (carbonated) soft drinks. ? Caffeinated energy drinks and soft drinks.  Limit high-fat foods such as: ? Fatty meat or fried foods. ? Whole milk, cream, butter, or ice cream. ? Nuts and nut butters. ? Pastries, donuts, and sweets made with butter or shortening.  Avoid foods that cause symptoms. These foods may be different for everyone.  Common foods that cause symptoms include: ? Tomatoes. ? Oranges, lemons, and limes. ? Peppers. ? Spicy food. ? Onions and garlic. ? Vinegar. Lifestyle  Maintain a healthy weight. Ask your doctor what weight is healthy for you. If you need to lose weight, work with your doctor to do so safely.  Exercise for at least 30 minutes for 5 or more days each week, or as told by your doctor.  Wear loose-fitting clothes.  Do not smoke. If you need help quitting, ask your doctor.  Sleep with the head of your bed higher than your feet. Use a wedge under the mattress or blocks under the bed frame to raise the head of the bed. Summary  When you have gastroesophageal reflux disease (GERD), food and lifestyle choices are very important in easing your symptoms.  Eat small meals often instead of 3 large meals a day. Eat your meals slowly, and in a place where you are relaxed.  Limit high-fat foods such as fatty meat or fried foods.  Avoid bending over or lying down until 2-3 hours after eating.  Avoid peppermint and spearmint, caffeine, alcohol, and chocolate. This information is not intended to replace advice given to you by your health care provider. Make sure you discuss any questions you have with your health care provider. Document Released: 09/12/2011 Document Revised: 07/04/2018 Document Reviewed: 04/18/2016 Elsevier Patient Education  2020 [employer].

## 2018-12-09 NOTE — Assessment & Plan Note (Addendum)
58 year old male who presents with GERD and chronic, intermittent epigastric pain that radiates up through his chest and into his neck that had worsened around the middle of August with pain occurring daily x 2 weeks.  This is now improving with only 2 episodes in the last 2 weeks, but this is still more frequent than it had been historically as symptoms were occurring once every 1 to 2 months.  Intermittent symptoms have been present for several years.  Patient reports symptoms are secondary to "hiatal hernia spasm."  Reports evaluation in the past including EKG during acute episode of pain without significant changes and EGD at [facility] about 7 years ago with conclusion of "hiatal hernia spasm."  No specific triggers identified.  Pain will resolve if patient forces air into his stomach.  No worsening of pain with exertion.  Also with chronic history of GERD with daily breakthrough symptoms on omeprazole twice daily, and dysphagia to solid foods like breads and potatoes.  No dysphagia to liquids or pills.  He does not follow a GERD diet and takes Excedrin Migraine daily.  Advil PM about twice a month. EKG on 11/04/18 with sinus bradycardia (HR 58) and possible left atrial enlargement. Signed by Dr. Nadara Mustard indicating no acute change.  Labs on 10/02/2018 with unremarkable CBC and CMP other than glucose of 111.  In the setting of known hiatal hernia, chronic GERD, and NSAID use, differentials include esophagitis, gastritis, cameron erosions, and duodenitis. Esophageal spasm may be contributing to patient's acute pain. Do not suspect cardiac etiology as EKGs have been unremarkable, pain is not associated with exertion, and stops with forcing air into the stomach.   Patient needs EGD with possible dilation with propofol with Dr. Oneida Alar in the near future for dysphagia and further evaluation of his epigastric pain.  Hold metformin the morning of procedure. Change omeprazole to Protonix 40 mg BID for uncontrolled  GERD Advised patient stop taking Excedrin Migraine and try to use tylenol instead. Advised to use no more than 3 g of tylenol a day.  Advised patient follow a GERD diet.  Handout provided. Request EGD and colonoscopy records from [place]. Follow-up after procedure.

## 2018-12-09 NOTE — Assessment & Plan Note (Signed)
Patient reports history of hiatal hernia diagnosed on EGD at [facility] about 7 years ago.   Requesting records.

## 2018-12-09 NOTE — Telephone Encounter (Signed)
Patient would like his protonix sent to [facility] for 90 day supply since this will be a long term medication.

## 2018-12-09 NOTE — Telephone Encounter (Signed)
Rx sent 

## 2018-12-09 NOTE — Assessment & Plan Note (Signed)
Patient reports bright red blood per rectum about 1-2 times a year.  None recently.  Present when constipated.  Reports last colonoscopy 3 to 4 years ago at [facility] without polyps.  States occasional brbpr was present back then as well.  Patient has started taking a probiotic and is now having bowel movements daily.  Prior to probiotic he would go 2+ days without a bowel movement.  No other alarm symptoms. Recent CBC on 10/02/2018 with hemoglobin 15.6.  No family history of colon cancer or colon polyps.  Request [facility] records from [place].

## 2018-12-09 NOTE — Assessment & Plan Note (Addendum)
Chronic. Uncontrolled.   Stop omeprazole 20 mg BID and start Protonix 40 mg BID 30 minutes before breakfast and dinner.  GERD diet. Handout provided.

## 2018-12-09 NOTE — Assessment & Plan Note (Addendum)
Dysphagia to solid foods like potatoes and breads.  No dysphagia to liquids or pills.  Patient reports prior EGD 7 years ago at [facility] with diagnosis of hiatal hernia.  No esophageal dilation at that time as he was not having dysphasia.  Also with chronic history of GERD that is uncontrolled and chronic intermittent epigastric pain that radiates up through the chest and into patient's neck as described below.  Proceed with EGD +/-dilation with propofol with Dr. fields in the near future as described below. Follow-up after procedure.

## 2018-12-16 NOTE — Progress Notes (Signed)
cc'ed to pcp °

## 2019-01-10 ENCOUNTER — Encounter (HOSPITAL_COMMUNITY): Payer: Self-pay

## 2019-01-10 ENCOUNTER — Encounter (HOSPITAL_COMMUNITY)
Admission: RE | Admit: 2019-01-10 | Discharge: 2019-01-10 | Disposition: A | Discharge status: Home / Self Care | Payer: Medicare Other | Source: Ambulatory Visit | Attending: Gastroenterology | Admitting: Gastroenterology

## 2019-01-10 ENCOUNTER — Other Ambulatory Visit: Payer: Self-pay

## 2019-01-10 ENCOUNTER — Other Ambulatory Visit (HOSPITAL_COMMUNITY)
Admission: RE | Admit: 2019-01-10 | Discharge: 2019-01-10 | Disposition: A | Discharge status: Home / Self Care | Payer: Medicare Other | Source: Ambulatory Visit | Attending: Gastroenterology | Admitting: Gastroenterology

## 2019-01-10 DIAGNOSIS — Z20828 Contact with and (suspected) exposure to other viral communicable diseases: Secondary | ICD-10-CM | POA: Insufficient documentation

## 2019-01-10 DIAGNOSIS — Z01812 Encounter for preprocedural laboratory examination: Secondary | ICD-10-CM | POA: Insufficient documentation

## 2019-01-10 LAB — SARS CORONAVIRUS 2 (TAT 6-24 HRS): SARS Coronavirus 2: NEGATIVE

## 2019-01-13 NOTE — Progress Notes (Signed)
CC'D TO PCP °

## 2019-01-14 ENCOUNTER — Encounter (HOSPITAL_COMMUNITY)
Admission: RE | Disposition: A | Discharge status: Home / Self Care | Payer: Self-pay | Source: Home / Self Care | Attending: Gastroenterology

## 2019-01-14 ENCOUNTER — Ambulatory Visit (HOSPITAL_COMMUNITY)
Admission: RE | Admit: 2019-01-14 | Discharge: 2019-01-14 | Disposition: A | Discharge status: Home / Self Care | Payer: Medicare Other | Attending: Gastroenterology | Admitting: Gastroenterology

## 2019-01-14 ENCOUNTER — Encounter (HOSPITAL_COMMUNITY): Payer: Self-pay | Admitting: *Deleted

## 2019-01-14 ENCOUNTER — Ambulatory Visit (HOSPITAL_COMMUNITY): Payer: Medicare Other | Admitting: Anesthesiology

## 2019-01-14 ENCOUNTER — Other Ambulatory Visit: Payer: Self-pay

## 2019-01-14 DIAGNOSIS — K2950 Unspecified chronic gastritis without bleeding: Secondary | ICD-10-CM | POA: Diagnosis not present

## 2019-01-14 DIAGNOSIS — Z79899 Other long term (current) drug therapy: Secondary | ICD-10-CM | POA: Diagnosis not present

## 2019-01-14 DIAGNOSIS — R1013 Epigastric pain: Secondary | ICD-10-CM

## 2019-01-14 DIAGNOSIS — K2970 Gastritis, unspecified, without bleeding: Secondary | ICD-10-CM | POA: Diagnosis not present

## 2019-01-14 DIAGNOSIS — Z87442 Personal history of urinary calculi: Secondary | ICD-10-CM | POA: Diagnosis not present

## 2019-01-14 DIAGNOSIS — Z885 Allergy status to narcotic agent status: Secondary | ICD-10-CM | POA: Insufficient documentation

## 2019-01-14 DIAGNOSIS — K219 Gastro-esophageal reflux disease without esophagitis: Secondary | ICD-10-CM | POA: Diagnosis not present

## 2019-01-14 DIAGNOSIS — K222 Esophageal obstruction: Secondary | ICD-10-CM | POA: Diagnosis not present

## 2019-01-14 DIAGNOSIS — K317 Polyp of stomach and duodenum: Secondary | ICD-10-CM

## 2019-01-14 DIAGNOSIS — E785 Hyperlipidemia, unspecified: Secondary | ICD-10-CM | POA: Diagnosis not present

## 2019-01-14 DIAGNOSIS — K449 Diaphragmatic hernia without obstruction or gangrene: Secondary | ICD-10-CM

## 2019-01-14 DIAGNOSIS — E781 Pure hyperglyceridemia: Secondary | ICD-10-CM | POA: Diagnosis not present

## 2019-01-14 DIAGNOSIS — R1310 Dysphagia, unspecified: Secondary | ICD-10-CM | POA: Insufficient documentation

## 2019-01-14 DIAGNOSIS — Z79891 Long term (current) use of opiate analgesic: Secondary | ICD-10-CM | POA: Diagnosis not present

## 2019-01-14 DIAGNOSIS — Z87891 Personal history of nicotine dependence: Secondary | ICD-10-CM | POA: Diagnosis not present

## 2019-01-14 HISTORY — PX: SAVORY DILATION: SHX5439

## 2019-01-14 HISTORY — PX: BIOPSY: SHX5522

## 2019-01-14 HISTORY — PX: ESOPHAGOGASTRODUODENOSCOPY (EGD) WITH PROPOFOL: SHX5813

## 2019-01-14 HISTORY — PX: POLYPECTOMY: SHX5525

## 2019-01-14 SURGERY — ESOPHAGOGASTRODUODENOSCOPY (EGD) WITH PROPOFOL
Anesthesia: General

## 2019-01-14 MED ORDER — MIDAZOLAM HCL 5 MG/5ML IJ SOLN
INTRAMUSCULAR | Status: DC | PRN
  Administered 2019-01-14: 2 mg via INTRAVENOUS

## 2019-01-14 MED ORDER — BACLOFEN 10 MG PO TABS: ORAL_TABLET | ORAL | 11 refills | Status: DC

## 2019-01-14 MED ORDER — PROMETHAZINE HCL 25 MG/ML IJ SOLN: 6.2500 mg | INTRAMUSCULAR | Status: DC | PRN

## 2019-01-14 MED ORDER — MIDAZOLAM HCL 2 MG/2ML IJ SOLN: 0.5000 mg | Freq: Once | INTRAMUSCULAR | Status: DC | PRN

## 2019-01-14 MED ORDER — LACTATED RINGERS IV SOLN
INTRAVENOUS | Status: DC
  Administered 2019-01-14: 13:00:00 via INTRAVENOUS

## 2019-01-14 MED ORDER — LIDOCAINE HCL (CARDIAC) PF 100 MG/5ML IV SOSY
PREFILLED_SYRINGE | INTRAVENOUS | Status: DC | PRN
  Administered 2019-01-14: 100 mg via INTRAVENOUS

## 2019-01-14 MED ORDER — LIDOCAINE VISCOUS HCL 2 % MT SOLN: 5.0000 mL | Freq: Once | OROMUCOSAL | Status: DC

## 2019-01-14 MED ORDER — MIDAZOLAM HCL 2 MG/2ML IJ SOLN
INTRAMUSCULAR | Status: AC
  Filled 2019-01-14: qty 2

## 2019-01-14 MED ORDER — LIDOCAINE VISCOUS HCL 2 % MT SOLN
OROMUCOSAL | Status: AC
  Filled 2019-01-14: qty 15

## 2019-01-14 MED ORDER — CHLORHEXIDINE GLUCONATE CLOTH 2 % EX PADS: 6.0000 | MEDICATED_PAD | Freq: Once | CUTANEOUS | Status: DC

## 2019-01-14 MED ORDER — PROPOFOL 500 MG/50ML IV EMUL
INTRAVENOUS | Status: DC | PRN
  Administered 2019-01-14: 150 ug/kg/min via INTRAVENOUS

## 2019-01-14 NOTE — Op Note (Signed)
[facility] Patient Name: Brian Doyle Procedure Date: 01/14/2019 1:48 PM MRN: [id number] Date of Birth: 01/25/1961 Attending MD: Barney Drain MD, MD CSN: [id number] Age: 58 Admit Type: Outpatient Procedure:                Upper GI endoscopy WITH COLD FORCEPS                            BIOPSY/ESOPHAGEAL DILATION Indications:              Dysphagia, Esophageal reflux symptoms that persist                            despite appropriate therapy. TRIED AND FAILED BID                            OMEPRAZOLE AND PROTONIX. Providers:                Barney Drain MD, MD, Jeanann Lewandowsky. Sharon Seller, RN, Nelma Rothman, Technician Referring MD:             Curlene Labrum Medicines:                Propofol per Anesthesia Complications:            No immediate complications. Estimated Blood Loss:     Estimated blood loss was minimal. Procedure:                Pre-Anesthesia Assessment:                           - Prior to the procedure, a History and Physical                            was performed, and patient medications and                            allergies were reviewed. The patient's tolerance of                            previous anesthesia was also reviewed. The risks                            and benefits of the procedure and the sedation                            options and risks were discussed with the patient.                            All questions were answered, and informed consent                            was obtained. Prior Anticoagulants: The patient has  taken no previous anticoagulant or antiplatelet                            agents. ASA Grade Assessment: II - A patient with                            mild systemic disease. After reviewing the risks                            and benefits, the patient was deemed in                            satisfactory condition to undergo the procedure.   After obtaining informed consent, the endoscope was                            passed under direct vision. Throughout the                            procedure, the patient's blood pressure, pulse, and                            oxygen saturations were monitored continuously. The                            [id number]) was introduced through the                            mouth, and advanced to the second part of duodenum.                            The upper GI endoscopy was somewhat difficult due                            to the patient's agitation. Successful completion                            of the procedure was aided by increasing the dose                            of sedation medication. The patient tolerated the                            procedure fairly well. Scope In: 2:17:07 PM Scope Out: 2:30:01 PM Total Procedure Duration: 0 hours 12 minutes 54 seconds  Findings:      A moderate Schatzki ring was found at the gastroesophageal junction.       Biopsies were obtained from the proximal(20 CM FROM THE INCISORS) and       distal esophagus(35 CM FROM THE INCISORS)with cold forceps for histology       of suspected eosinophilic esophagitis. A guidewire was placed and the       scope was withdrawn. Dilation was performed with a Savary dilator with       mild resistance at 15 mm,  16 mm and 17 mm. EGJ 40 CM FROM THE INCISORS.      Multiple small sessile polyps with no bleeding and no stigmata of recent       bleeding were found in the gastric fundus and in the gastric body. The       polyp was removed with a cold biopsy forceps. Resection and retrieval       were complete.      Localized mild inflammation characterized by congestion (edema) and       erythema was found in the gastric antrum.       Biopsies(2;BODY,2:ANTRUM,1:INCISURA) were taken with a cold forceps for       Helicobacter pylori testing.      The examined duodenum was normal. Impression:               -  Moderate Schatzki ring. Biopsied. Dilated.                           - Multiple gastric polyps. Resected and retrieved.                           - MILD Gastritis. Biopsied. Moderate Sedation:      Per Anesthesia Care Recommendation:           - Patient has a contact number available for                            emergencies. The signs and symptoms of potential                            delayed complications were discussed with the                            patient. Return to normal activities tomorrow.                            Written discharge instructions were provided to the                            patient.                           - Low fat diet. AVOID REFLUX TRIGGERS.                           - Continue present medications. PEPCID OR TAGAMET                            AS NEEDED.                           - Await pathology results.                           - Return to GI office in 4 months. SEE SURGERY TO                            DISCUSS THE NISSEN FUNDOPILICATION  DUE TO FAILURE                            OF MEDICAL MANAGEMENT TO CONTROL REFLUX. Procedure Code(s):        --- Professional ---                           [id number], Esophagogastroduodenoscopy, flexible,                            transoral; with insertion of guide wire followed by                            passage of dilator(s) through esophagus over guide                            wire                           43239, 50, Esophagogastroduodenoscopy, flexible,                            transoral; with biopsy, single or multiple Diagnosis Code(s):        --- Professional ---                           K22.2, Esophageal obstruction                           K31.7, Polyp of stomach and duodenum                           K29.70, Gastritis, unspecified, without bleeding                           R13.10, Dysphagia, unspecified                           K21.9, Gastro-esophageal reflux disease without                             esophagitis CPT copyright 2019 [employer]. All rights reserved. The codes documented in this report are preliminary and upon coder review may  be revised to meet current compliance requirements. Barney Drain, MD Barney Drain MD, MD 01/14/2019 2:40:15 PM This report has been signed electronically. Number of Addenda: 0

## 2019-01-14 NOTE — H&P (Signed)
Primary Care Physician:  Curlene Labrum, MD Primary Gastroenterologist:  Dr. Oneida Alar  Pre-Procedure History & Physical: HPI:  Brian Doyle is a 58 y.o. male here for dysphagia/dyspepsia.  Past Medical History:  Diagnosis Date  . Chronic lower back pain   . GERD (gastroesophageal reflux disease)   . Hiatal hernia   . HLD (hyperlipidemia)   . Hypertriglyceridemia   . Neuropathy   . Pre-diabetes     Past Surgical History:  Procedure Laterality Date  . COLONOSCOPY    . ELBOW SURGERY Left   . ESOPHAGOGASTRODUODENOSCOPY    . lower back surgery     x 2 (March 2010; Feb 2011)  . SHOULDER SURGERY Left   . vasectomy      Prior to Admission medications   Medication Sig Start Date End Date Taking? Authorizing Provider  gabapentin (NEURONTIN) 300 MG capsule Take 900 mg by mouth 3 (three) times daily.   Yes [provider]  HYDROcodone-acetaminophen (NORCO/VICODIN) 5-325 MG tablet Take 1 tablet by mouth 3 (three) times daily.   Yes [provider]  Multiple Vitamin (MULTIVITAMIN) capsule Take 1 capsule by mouth daily.   Yes [provider]  omeprazole (PRILOSEC) 20 MG capsule Take 20 mg by mouth 2 (two) times daily before a meal.   Yes [provider]  pantoprazole (PROTONIX) 40 MG tablet Take 1 tablet (40 mg total) by mouth 2 (two) times daily. Patient taking differently: Take 40 mg by mouth daily.  12/09/18  Yes Aliene Altes S, PA-C  rosuvastatin (CRESTOR) 5 MG tablet Take 5 mg by mouth 3 (three) times a week.    Yes [provider]  tiZANidine (ZANAFLEX) 4 MG capsule Take 4 mg by mouth 3 (three) times daily.   Yes [provider]    Allergies as of 12/09/2018 - Review Complete 12/09/2018  Allergen Reaction Noted  . Morphine and related  12/09/2018    Family History  Problem Relation Age of Onset  . Lung cancer Mother   . Colon cancer Neg Hx   . Colon polyps Neg Hx     Social History   Socioeconomic History  .  Marital status: Married    Spouse name: Not on file  . Number of children: Not on file  . Years of education: Not on file  . Highest education level: Not on file  Occupational History  . Not on file  Social Needs  . Financial resource strain: Not on file  . Food insecurity    Worry: Not on file    Inability: Not on file  . Transportation needs    Medical: Not on file    Non-medical: Not on file  Tobacco Use  . Smoking status: Former Smoker    Types: Cigarettes  . Smokeless tobacco: Never Used  Substance and Sexual Activity  . Alcohol use: Yes    Comment: occas  . Drug use: Never  . Sexual activity: Yes  Lifestyle  . Physical activity    Days per week: Not on file    Minutes per session: Not on file  . Stress: Not on file  Relationships  . Social Herbalist on phone: Not on file    Gets together: Not on file    Attends religious service: Not on file    Active member of club or organization: Not on file    Attends meetings of clubs or organizations: Not on file    Relationship status: Not on file  .  Intimate partner violence    Fear of current or ex partner: Not on file    Emotionally abused: Not on file    Physically abused: Not on file    Forced sexual activity: Not on file  Other Topics Concern  . Not on file  Social History Narrative  . Not on file    Review of Systems: See HPI, otherwise negative ROS   Physical Exam: BP 106/77   Pulse (!) 58   Temp 98.6 F (37 C) (Oral)   Resp 16   Ht 6' (1.829 m)   Wt 112.2 kg   BMI 33.55 kg/m  General:   Alert,  pleasant and cooperative in NAD Head:  Normocephalic and atraumatic. Neck:  Supple; Lungs:  Clear throughout to auscultation.    Heart:  Regular rate and rhythm. Abdomen:  Soft, nontender and nondistended. Normal bowel sounds, without guarding, and without rebound.   Neurologic:  Alert and  oriented x4;  grossly normal neurologically.  Impression/Plan:    DYSPHAGIA/dyspepsia. Plan: 1.  Egd/?dil today. DISCUSSED PROCEDURE, BENEFITS, & RISKS: < 1% chance of medication reaction, bleeding, perforation, or ASPIRATION.

## 2019-01-14 NOTE — Anesthesia Preprocedure Evaluation (Signed)
Anesthesia Evaluation  Patient identified by MRN, date of birth, ID band Patient awake    Reviewed: Allergy & Precautions, NPO status , Patient's Chart, lab work & pertinent test results  Airway Mallampati: I  TM Distance: >3 FB Neck ROM: Full    Dental no notable dental hx. (+) Teeth Intact   Pulmonary neg pulmonary ROS, former smoker,    Pulmonary exam normal breath sounds clear to auscultation       Cardiovascular Exercise Tolerance: Good negative cardio ROS Normal cardiovascular examI Rhythm:Regular Rate:Normal     Neuro/Psych negative neurological ROS  negative psych ROS   GI/Hepatic Neg liver ROS, hiatal hernia, GERD  Medicated and Controlled,  Endo/Other  negative endocrine ROS  Renal/GU negative Renal ROSReports chronic kidney stones -passes ~10-20/year  negative genitourinary   Musculoskeletal negative musculoskeletal ROS (+)   Abdominal   Peds negative pediatric ROS (+)  Hematology negative hematology ROS (+)   Anesthesia Other Findings   Reproductive/Obstetrics negative OB ROS                             Anesthesia Physical Anesthesia Plan  ASA: II  Anesthesia Plan: General   Post-op Pain Management:    Induction: Intravenous  PONV Risk Score and Plan: 2 and Propofol infusion and TIVA  Airway Management Planned: Simple Face Mask and Nasal Cannula  Additional Equipment:   Intra-op Plan:   Post-operative Plan:   Informed Consent: I have reviewed the patients History and Physical, chart, labs and discussed the procedure including the risks, benefits and alternatives for the proposed anesthesia with the patient or authorized representative who has indicated his/her understanding and acceptance.     Dental advisory given  Plan Discussed with: CRNA  Anesthesia Plan Comments: (Plan Full PPE use  Plan GA with GETA as needed d/w pt -WTP with same after Q&A)         Anesthesia Quick Evaluation

## 2019-01-14 NOTE — Transfer of Care (Signed)
Immediate Anesthesia Transfer of Care Note  Patient: Brian Doyle  Procedure(s) Performed: ESOPHAGOGASTRODUODENOSCOPY (EGD) WITH PROPOFOL (N/A ) SAVORY DILATION (N/A ) BIOPSY POLYPECTOMY  Patient Location: PACU  Anesthesia Type:MAC  Level of Consciousness: awake, alert , oriented and patient cooperative  Airway & Oxygen Therapy: Patient Spontanous Breathing  Post-op Assessment: Report given to RN and Post -op Vital signs reviewed and stable  Post vital signs: Reviewed and stable  Last Vitals:  Vitals Value Taken Time  BP    Temp    Pulse    Resp    SpO2      Last Pain:  Vitals:   01/14/19 1410  TempSrc:   PainSc: 4          Complications: No apparent anesthesia complications

## 2019-01-14 NOTE — Discharge Instructions (Signed)
I dilated your esophagus DUE TO  DEFINITE NARROWING IN YOUR esophagus. You DO NOT have a hiatal hernia. You have gastritis AND BENIGN APPEARING STOMACH POLYPS. I biopsied your stomach & ESOPHAGUS.   TO CONTROL HEARTBURN/REFLUX/REGURGITATION:    1. DRINK WATER TO KEEP YOUR URINE LIGHT YELLOW. STRICTLY AVOID CAFFEINE.    2. SLEEP WITH ON A WEDGE OR PUT THE HEAD OF YOUR BED ON 6 INCH BLOCKS TO KEEP YOUR HEAD ABOVE YOUR HEART WHILE YOU SLEEP.    3. Avoid reflux triggers. MEATS SHOULD BE BAKED, BROILED, OR BOILED. AVOID FRIED FOODS AND FAST FOOD.      4. CONTINUE OMEPRAZOLE OR PANTOPRAZOLE. YOU DON'T NEED BOTH.    5. USE PEPCID OR TAGAMET FOR BREAKTHROUGH HEARTBURN/REFLUX.    6. SEE SURGERY TO PERFORM REFLUX SURGERY.   Add baclofen 30 mins prior to breakfast and at bedtime. THE ONLY SIDE EFFECT IS DIZZINESS.   YOUR BIOPSY RESULTS WILL BE BACK IN 5 BUSINESS DAYS.  FOLLOW UP IN 4 MOS.  UPPER ENDOSCOPY AFTER CARE Read the instructions outlined below and refer to this sheet in the next week. These discharge instructions provide you with general information on caring for yourself after you leave the hospital. While your treatment has been planned according to the most current medical practices available, unavoidable complications occasionally occur. If you have any problems or questions after discharge, call DR. Shakeem Stern, [phone].  ACTIVITY  You may resume your regular activity, but move at a slower pace for the next 24 hours.   Take frequent rest periods for the next 24 hours.   Walking will help get rid of the air and reduce the bloated feeling in your belly (abdomen).   No driving for 24 hours (because of the medicine (anesthesia) used during the test).   You may shower.   Do not sign any important legal documents or operate any machinery for 24 hours (because of the anesthesia used during the test).    NUTRITION  Drink plenty of fluids.   You may resume your normal diet  as instructed by your doctor.   Begin with a light meal and progress to your normal diet. Heavy or fried foods are harder to digest and may make you feel sick to your stomach (nauseated).   Avoid alcoholic beverages for 24 hours or as instructed.    MEDICATIONS  You may resume your normal medications.   WHAT YOU CAN EXPECT TODAY  Some feelings of bloating in the abdomen.   Passage of more gas than usual.    IF YOU HAD A BIOPSY TAKEN DURING THE UPPER ENDOSCOPY:  Eat a soft diet IF YOU HAVE NAUSEA, BLOATING, ABDOMINAL PAIN, OR VOMITING.    FINDING OUT THE RESULTS OF YOUR TEST Not all test results are available during your visit. DR. Oneida Alar WILL CALL YOU WITHIN 7 DAYS OF YOUR PROCEDUE WITH YOUR RESULTS. Do not assume everything is normal if you have not heard from DR. Cane Dubray IN ONE WEEK, CALL HER OFFICE AT [phone].  SEEK IMMEDIATE MEDICAL ATTENTION AND CALL THE OFFICE: [phone] IF:  You have more than a spotting of blood in your stool.   Your belly is swollen (abdominal distention).   You are nauseated or vomiting.   You have a temperature over 101F.   You have abdominal pain or discomfort that is severe or gets worse throughout the day.   Lifestyle and home remedies TO MANAGE REFLUX/HEARTBURN  You may eliminate or reduce the frequency of heartburn by  making the following lifestyle changes:   Control your weight. Being overweight is a major risk factor for heartburn and GERD. Excess pounds put pressure on your abdomen, pushing up your stomach and causing acid to back up into your esophagus.    Eat smaller meals. 4 TO 6 MEALS A DAY. This reduces pressure on the lower esophageal sphincter, helping to prevent the valve from opening and acid from washing back into your esophagus.    Loosen your belt. Clothes that fit tightly around your waist put pressure on your abdomen and the lower esophageal sphincter.    Eliminate heartburn triggers. Everyone has  specific triggers. Common triggers such as fatty or fried foods, spicy food, tomato sauce, carbonated beverages, alcohol, chocolate, mint, garlic, onion, caffeine and nicotine may make heartburn worse.    Avoid stooping or bending. Tying your shoes is OK. Bending over for longer periods to weed your garden isn't, especially soon after eating.    Don't lie down after a meal. Wait at least three to four hours after eating before going to bed, and don't lie down right after eating.    SLEEP WITH ON A WEDGE OR PUT THE HEAD OF YOUR BED ON 6 INCH BLOCKS TO KEEP YOUR HEAD ABOVE YOUR HEART WHILE YOU SLEEP.            Hiatal Hernia A hiatal hernia occurs when a part of the stomach slides above the diaphragm. The diaphragm is the thin muscle separating the belly (abdomen) from the chest. A hiatal hernia can be something you are born with or develop over time. Hiatal hernias may allow stomach acid to flow back into your esophagus, the tube which carries food from your mouth to your stomach. If this acid causes problems it is called GERD (gastro-esophageal reflux disease).   SYMPTOMS Common symptoms of GERD are heartburn (burning in your chest). This is worse when lying down or bending over. It may also cause belching and indigestion. Some of the things which make GERD worse are:  Increased weight pushes on stomach making acid rise more easily.   Smoking markedly increases acid production.   Alcohol decreases lower esophageal sphincter pressure (valve between stomach and esophagus), allowing acid from stomach into esophagus.   Late evening meals and going to bed with a full stomach increases pressure.    DYSPHAGIA DYSPHAGIA can be caused by stomach acid backing up into the tube that carries food from the mouth down to the stomach (lower esophagus).  TREATMENT There are a number of medicines used to treat DYSPHAGIA including: Antacids.  Proton-pump inhibitors: omeprazole OR  PANTOPRAZOLE

## 2019-01-14 NOTE — Anesthesia Postprocedure Evaluation (Signed)
Anesthesia Post Note  Patient: Khaliel Morey Berberian  Procedure(s) Performed: ESOPHAGOGASTRODUODENOSCOPY (EGD) WITH PROPOFOL (N/A ) SAVORY DILATION (N/A ) BIOPSY POLYPECTOMY  Patient location during evaluation: PACU Anesthesia Type: MAC Level of consciousness: awake and alert, oriented and patient cooperative Pain management: pain level controlled Vital Signs Assessment: post-procedure vital signs reviewed and stable Respiratory status: spontaneous breathing, respiratory function stable and nonlabored ventilation Cardiovascular status: stable Postop Assessment: no apparent nausea or vomiting Anesthetic complications: no     Last Vitals:  Vitals:   01/14/19 1243  BP: 106/77  Pulse: (!) 58  Resp: 16  Temp: 37 C    Last Pain:  Vitals:   01/14/19 1410  TempSrc:   PainSc: 4                  Hanna Ra

## 2019-01-17 LAB — SURGICAL PATHOLOGY

## 2019-01-20 ENCOUNTER — Encounter (HOSPITAL_COMMUNITY): Payer: Self-pay | Admitting: Gastroenterology

## 2019-01-25 ENCOUNTER — Telehealth: Payer: Self-pay | Admitting: Gastroenterology

## 2019-01-25 NOTE — Telephone Encounter (Signed)
PLEASE CALL PT. HE HAD BENIGN POLYPS REMOVED FROM HIS STOMACH. THE ESOPHAGUS BIOPSIES ARE NORMAL. HE HAS MILD GASTRITIS.   TO CONTROL HEARTBURN/REFLUX/REGURGITATION:   1. DRINK WATER TO KEEP YOUR URINE LIGHT YELLOW. STRICTLY AVOID CAFFEINE.    2. SLEEP WITH ON A WEDGE OR PUT THE HEAD OF YOUR BED ON 6 INCH BLOCKS TO KEEP YOUR HEAD ABOVE YOUR HEART WHILE YOU SLEEP.    3. Avoid reflux triggers. MEATS SHOULD BE BAKED, BROILED, OR BOILED. AVOID FRIED FOODS AND FAST FOOD.      4. CONTINUE OMEPRAZOLE OR PANTOPRAZOLE. YOU DON'T NEED BOTH.    5. USE PEPCID OR TAGAMET FOR BREAKTHROUGH HEARTBURN/REFLUX.    6. SEE SURGERY TO PERFORM REFLUX SURGERY.   Add baclofen 30 mins prior to breakfast and at bedtime. THE ONLY SIDE EFFECT IS DIZZINESS.   FOLLOW UP IN 4 MOS, [id number].

## 2019-01-27 NOTE — Telephone Encounter (Signed)
Mailed results to pt.

## 2019-01-27 NOTE — Telephone Encounter (Signed)
PATIENT SCHEDULED  °

## 2019-01-29 DIAGNOSIS — M961 Postlaminectomy syndrome, not elsewhere classified: Secondary | ICD-10-CM | POA: Diagnosis not present

## 2019-01-29 DIAGNOSIS — M5136 Other intervertebral disc degeneration, lumbar region: Secondary | ICD-10-CM | POA: Diagnosis not present

## 2019-01-29 DIAGNOSIS — R030 Elevated blood-pressure reading, without diagnosis of hypertension: Secondary | ICD-10-CM | POA: Diagnosis not present

## 2019-02-04 DIAGNOSIS — M5116 Intervertebral disc disorders with radiculopathy, lumbar region: Secondary | ICD-10-CM | POA: Diagnosis not present

## 2019-02-04 DIAGNOSIS — M5416 Radiculopathy, lumbar region: Secondary | ICD-10-CM | POA: Diagnosis not present

## 2019-02-04 DIAGNOSIS — Z981 Arthrodesis status: Secondary | ICD-10-CM | POA: Diagnosis not present

## 2019-02-22 ENCOUNTER — Encounter: Payer: Self-pay | Admitting: Gastroenterology

## 2019-03-27 DIAGNOSIS — K219 Gastro-esophageal reflux disease without esophagitis: Secondary | ICD-10-CM | POA: Diagnosis not present

## 2019-03-27 DIAGNOSIS — E782 Mixed hyperlipidemia: Secondary | ICD-10-CM | POA: Diagnosis not present

## 2019-04-04 DIAGNOSIS — E782 Mixed hyperlipidemia: Secondary | ICD-10-CM | POA: Diagnosis not present

## 2019-04-04 DIAGNOSIS — K219 Gastro-esophageal reflux disease without esophagitis: Secondary | ICD-10-CM | POA: Diagnosis not present

## 2019-04-04 DIAGNOSIS — E7800 Pure hypercholesterolemia, unspecified: Secondary | ICD-10-CM | POA: Diagnosis not present

## 2019-04-08 DIAGNOSIS — K219 Gastro-esophageal reflux disease without esophagitis: Secondary | ICD-10-CM | POA: Diagnosis not present

## 2019-04-08 DIAGNOSIS — Z0001 Encounter for general adult medical examination with abnormal findings: Secondary | ICD-10-CM | POA: Diagnosis not present

## 2019-04-08 DIAGNOSIS — M545 Low back pain: Secondary | ICD-10-CM | POA: Diagnosis not present

## 2019-04-08 DIAGNOSIS — Z72820 Sleep deprivation: Secondary | ICD-10-CM | POA: Diagnosis not present

## 2019-04-08 DIAGNOSIS — E782 Mixed hyperlipidemia: Secondary | ICD-10-CM | POA: Diagnosis not present

## 2019-04-25 DIAGNOSIS — K219 Gastro-esophageal reflux disease without esophagitis: Secondary | ICD-10-CM | POA: Diagnosis not present

## 2019-04-25 DIAGNOSIS — E7849 Other hyperlipidemia: Secondary | ICD-10-CM | POA: Diagnosis not present

## 2019-04-30 DIAGNOSIS — M5136 Other intervertebral disc degeneration, lumbar region: Secondary | ICD-10-CM | POA: Diagnosis not present

## 2019-04-30 DIAGNOSIS — M961 Postlaminectomy syndrome, not elsewhere classified: Secondary | ICD-10-CM | POA: Diagnosis not present

## 2019-04-30 DIAGNOSIS — M5416 Radiculopathy, lumbar region: Secondary | ICD-10-CM | POA: Diagnosis not present

## 2019-05-08 DIAGNOSIS — M5116 Intervertebral disc disorders with radiculopathy, lumbar region: Secondary | ICD-10-CM | POA: Diagnosis not present

## 2019-05-08 DIAGNOSIS — M5416 Radiculopathy, lumbar region: Secondary | ICD-10-CM | POA: Diagnosis not present

## 2019-05-23 ENCOUNTER — Ambulatory Visit: Payer: Medicare Other | Admitting: Gastroenterology

## 2019-07-24 DIAGNOSIS — M5136 Other intervertebral disc degeneration, lumbar region: Secondary | ICD-10-CM | POA: Diagnosis not present

## 2019-07-24 DIAGNOSIS — M961 Postlaminectomy syndrome, not elsewhere classified: Secondary | ICD-10-CM | POA: Diagnosis not present

## 2019-08-05 DIAGNOSIS — M5416 Radiculopathy, lumbar region: Secondary | ICD-10-CM | POA: Diagnosis not present

## 2019-08-05 DIAGNOSIS — M5116 Intervertebral disc disorders with radiculopathy, lumbar region: Secondary | ICD-10-CM | POA: Diagnosis not present

## 2019-10-08 DIAGNOSIS — E7800 Pure hypercholesterolemia, unspecified: Secondary | ICD-10-CM | POA: Diagnosis not present

## 2019-10-08 DIAGNOSIS — E782 Mixed hyperlipidemia: Secondary | ICD-10-CM | POA: Diagnosis not present

## 2019-10-08 DIAGNOSIS — K219 Gastro-esophageal reflux disease without esophagitis: Secondary | ICD-10-CM | POA: Diagnosis not present

## 2019-10-08 DIAGNOSIS — R739 Hyperglycemia, unspecified: Secondary | ICD-10-CM | POA: Diagnosis not present

## 2019-10-10 DIAGNOSIS — M47816 Spondylosis without myelopathy or radiculopathy, lumbar region: Secondary | ICD-10-CM | POA: Diagnosis not present

## 2019-10-10 DIAGNOSIS — M961 Postlaminectomy syndrome, not elsewhere classified: Secondary | ICD-10-CM | POA: Diagnosis not present

## 2019-10-10 DIAGNOSIS — I10 Essential (primary) hypertension: Secondary | ICD-10-CM | POA: Diagnosis not present

## 2019-10-15 DIAGNOSIS — C4A62 Merkel cell carcinoma of left upper limb, including shoulder: Secondary | ICD-10-CM | POA: Diagnosis not present

## 2019-10-15 DIAGNOSIS — R238 Other skin changes: Secondary | ICD-10-CM | POA: Diagnosis not present

## 2019-10-15 DIAGNOSIS — E782 Mixed hyperlipidemia: Secondary | ICD-10-CM | POA: Diagnosis not present

## 2019-10-15 DIAGNOSIS — Z72820 Sleep deprivation: Secondary | ICD-10-CM | POA: Diagnosis not present

## 2019-10-15 DIAGNOSIS — M545 Low back pain: Secondary | ICD-10-CM | POA: Diagnosis not present

## 2019-10-15 DIAGNOSIS — C4490 Unspecified malignant neoplasm of skin, unspecified: Secondary | ICD-10-CM | POA: Diagnosis not present

## 2019-10-15 DIAGNOSIS — K219 Gastro-esophageal reflux disease without esophagitis: Secondary | ICD-10-CM | POA: Diagnosis not present

## 2019-10-16 DIAGNOSIS — C44619 Basal cell carcinoma of skin of left upper limb, including shoulder: Secondary | ICD-10-CM | POA: Diagnosis not present

## 2019-10-16 DIAGNOSIS — D485 Neoplasm of uncertain behavior of skin: Secondary | ICD-10-CM | POA: Diagnosis not present

## 2019-10-28 DIAGNOSIS — M47816 Spondylosis without myelopathy or radiculopathy, lumbar region: Secondary | ICD-10-CM | POA: Diagnosis not present

## 2019-10-28 DIAGNOSIS — Z981 Arthrodesis status: Secondary | ICD-10-CM | POA: Diagnosis not present

## 2019-11-17 DIAGNOSIS — Z08 Encounter for follow-up examination after completed treatment for malignant neoplasm: Secondary | ICD-10-CM | POA: Diagnosis not present

## 2019-11-17 DIAGNOSIS — C44612 Basal cell carcinoma of skin of right upper limb, including shoulder: Secondary | ICD-10-CM | POA: Diagnosis not present

## 2019-11-17 DIAGNOSIS — C44519 Basal cell carcinoma of skin of other part of trunk: Secondary | ICD-10-CM | POA: Diagnosis not present

## 2019-11-17 DIAGNOSIS — Z1283 Encounter for screening for malignant neoplasm of skin: Secondary | ICD-10-CM | POA: Diagnosis not present

## 2019-11-17 DIAGNOSIS — Z85828 Personal history of other malignant neoplasm of skin: Secondary | ICD-10-CM | POA: Diagnosis not present

## 2019-11-17 DIAGNOSIS — D2361 Other benign neoplasm of skin of right upper limb, including shoulder: Secondary | ICD-10-CM | POA: Diagnosis not present

## 2019-11-17 DIAGNOSIS — B078 Other viral warts: Secondary | ICD-10-CM | POA: Diagnosis not present

## 2019-11-17 DIAGNOSIS — D225 Melanocytic nevi of trunk: Secondary | ICD-10-CM | POA: Diagnosis not present

## 2019-12-24 DIAGNOSIS — M47816 Spondylosis without myelopathy or radiculopathy, lumbar region: Secondary | ICD-10-CM | POA: Diagnosis not present

## 2019-12-29 DIAGNOSIS — C44519 Basal cell carcinoma of skin of other part of trunk: Secondary | ICD-10-CM | POA: Diagnosis not present

## 2019-12-29 DIAGNOSIS — D485 Neoplasm of uncertain behavior of skin: Secondary | ICD-10-CM | POA: Diagnosis not present

## 2019-12-29 DIAGNOSIS — Z08 Encounter for follow-up examination after completed treatment for malignant neoplasm: Secondary | ICD-10-CM | POA: Diagnosis not present

## 2019-12-29 DIAGNOSIS — B078 Other viral warts: Secondary | ICD-10-CM | POA: Diagnosis not present

## 2019-12-29 DIAGNOSIS — Z85828 Personal history of other malignant neoplasm of skin: Secondary | ICD-10-CM | POA: Diagnosis not present

## 2019-12-29 DIAGNOSIS — L905 Scar conditions and fibrosis of skin: Secondary | ICD-10-CM | POA: Diagnosis not present

## 2019-12-31 DIAGNOSIS — M47816 Spondylosis without myelopathy or radiculopathy, lumbar region: Secondary | ICD-10-CM | POA: Diagnosis not present

## 2020-01-06 DIAGNOSIS — M47816 Spondylosis without myelopathy or radiculopathy, lumbar region: Secondary | ICD-10-CM | POA: Diagnosis not present

## 2020-01-13 DIAGNOSIS — M47816 Spondylosis without myelopathy or radiculopathy, lumbar region: Secondary | ICD-10-CM | POA: Diagnosis not present

## 2020-01-14 DIAGNOSIS — M5136 Other intervertebral disc degeneration, lumbar region: Secondary | ICD-10-CM | POA: Diagnosis not present

## 2020-01-14 DIAGNOSIS — M47816 Spondylosis without myelopathy or radiculopathy, lumbar region: Secondary | ICD-10-CM | POA: Diagnosis not present

## 2020-01-14 DIAGNOSIS — R030 Elevated blood-pressure reading, without diagnosis of hypertension: Secondary | ICD-10-CM | POA: Diagnosis not present

## 2020-01-14 DIAGNOSIS — M961 Postlaminectomy syndrome, not elsewhere classified: Secondary | ICD-10-CM | POA: Diagnosis not present

## 2020-01-20 DIAGNOSIS — M47816 Spondylosis without myelopathy or radiculopathy, lumbar region: Secondary | ICD-10-CM | POA: Diagnosis not present

## 2020-01-23 ENCOUNTER — Other Ambulatory Visit: Payer: Self-pay

## 2020-01-23 MED ORDER — BACLOFEN 10 MG PO TABS: ORAL_TABLET | ORAL | 3 refills | Status: DC

## 2020-01-27 DIAGNOSIS — M47816 Spondylosis without myelopathy or radiculopathy, lumbar region: Secondary | ICD-10-CM | POA: Diagnosis not present

## 2020-02-24 DIAGNOSIS — K219 Gastro-esophageal reflux disease without esophagitis: Secondary | ICD-10-CM | POA: Diagnosis not present

## 2020-02-24 DIAGNOSIS — R739 Hyperglycemia, unspecified: Secondary | ICD-10-CM | POA: Diagnosis not present

## 2020-02-24 DIAGNOSIS — Z1321 Encounter for screening for nutritional disorder: Secondary | ICD-10-CM | POA: Diagnosis not present

## 2020-02-24 DIAGNOSIS — E782 Mixed hyperlipidemia: Secondary | ICD-10-CM | POA: Diagnosis not present

## 2020-02-24 DIAGNOSIS — E7800 Pure hypercholesterolemia, unspecified: Secondary | ICD-10-CM | POA: Diagnosis not present

## 2020-02-25 DIAGNOSIS — M5136 Other intervertebral disc degeneration, lumbar region: Secondary | ICD-10-CM | POA: Diagnosis not present

## 2020-02-25 DIAGNOSIS — R030 Elevated blood-pressure reading, without diagnosis of hypertension: Secondary | ICD-10-CM | POA: Diagnosis not present

## 2020-02-27 DIAGNOSIS — E782 Mixed hyperlipidemia: Secondary | ICD-10-CM | POA: Diagnosis not present

## 2020-02-27 DIAGNOSIS — Z72820 Sleep deprivation: Secondary | ICD-10-CM | POA: Diagnosis not present

## 2020-02-27 DIAGNOSIS — C4490 Unspecified malignant neoplasm of skin, unspecified: Secondary | ICD-10-CM | POA: Diagnosis not present

## 2020-02-27 DIAGNOSIS — K219 Gastro-esophageal reflux disease without esophagitis: Secondary | ICD-10-CM | POA: Diagnosis not present

## 2020-04-07 DIAGNOSIS — M961 Postlaminectomy syndrome, not elsewhere classified: Secondary | ICD-10-CM | POA: Diagnosis not present

## 2020-04-07 DIAGNOSIS — M5136 Other intervertebral disc degeneration, lumbar region: Secondary | ICD-10-CM | POA: Diagnosis not present

## 2020-04-07 DIAGNOSIS — M5416 Radiculopathy, lumbar region: Secondary | ICD-10-CM | POA: Diagnosis not present

## 2020-05-25 DIAGNOSIS — M79645 Pain in left finger(s): Secondary | ICD-10-CM | POA: Diagnosis not present

## 2020-05-25 DIAGNOSIS — M25512 Pain in left shoulder: Secondary | ICD-10-CM | POA: Diagnosis not present

## 2020-05-25 DIAGNOSIS — G8929 Other chronic pain: Secondary | ICD-10-CM | POA: Diagnosis not present

## 2020-05-25 DIAGNOSIS — M7061 Trochanteric bursitis, right hip: Secondary | ICD-10-CM | POA: Diagnosis not present

## 2020-05-27 DIAGNOSIS — G8929 Other chronic pain: Secondary | ICD-10-CM | POA: Diagnosis not present

## 2020-05-27 DIAGNOSIS — M25512 Pain in left shoulder: Secondary | ICD-10-CM | POA: Diagnosis not present

## 2020-05-27 DIAGNOSIS — S43432A Superior glenoid labrum lesion of left shoulder, initial encounter: Secondary | ICD-10-CM | POA: Diagnosis not present

## 2020-05-27 DIAGNOSIS — Z9889 Other specified postprocedural states: Secondary | ICD-10-CM | POA: Diagnosis not present

## 2020-06-23 DIAGNOSIS — K219 Gastro-esophageal reflux disease without esophagitis: Secondary | ICD-10-CM | POA: Diagnosis not present

## 2020-06-23 DIAGNOSIS — M961 Postlaminectomy syndrome, not elsewhere classified: Secondary | ICD-10-CM | POA: Diagnosis not present

## 2020-06-23 DIAGNOSIS — E782 Mixed hyperlipidemia: Secondary | ICD-10-CM | POA: Diagnosis not present

## 2020-06-23 DIAGNOSIS — M5136 Other intervertebral disc degeneration, lumbar region: Secondary | ICD-10-CM | POA: Diagnosis not present

## 2020-07-18 DIAGNOSIS — K5730 Diverticulosis of large intestine without perforation or abscess without bleeding: Secondary | ICD-10-CM | POA: Diagnosis not present

## 2020-07-18 DIAGNOSIS — N049 Nephrotic syndrome with unspecified morphologic changes: Secondary | ICD-10-CM | POA: Diagnosis not present

## 2020-07-18 DIAGNOSIS — R109 Unspecified abdominal pain: Secondary | ICD-10-CM | POA: Diagnosis not present

## 2020-07-18 DIAGNOSIS — K3189 Other diseases of stomach and duodenum: Secondary | ICD-10-CM | POA: Diagnosis not present

## 2020-07-18 DIAGNOSIS — N200 Calculus of kidney: Secondary | ICD-10-CM | POA: Diagnosis not present

## 2020-07-18 DIAGNOSIS — N132 Hydronephrosis with renal and ureteral calculous obstruction: Secondary | ICD-10-CM | POA: Diagnosis not present

## 2020-07-21 ENCOUNTER — Other Ambulatory Visit: Payer: Self-pay

## 2020-07-21 ENCOUNTER — Ambulatory Visit: Payer: Self-pay | Admitting: Urology

## 2020-07-21 DIAGNOSIS — N200 Calculus of kidney: Secondary | ICD-10-CM

## 2020-07-22 ENCOUNTER — Encounter: Payer: Self-pay | Admitting: Urology

## 2020-07-22 ENCOUNTER — Ambulatory Visit (HOSPITAL_COMMUNITY)
Admission: RE | Admit: 2020-07-22 | Discharge: 2020-07-22 | Disposition: A | Discharge status: Home / Self Care | Payer: Medicare Other | Source: Ambulatory Visit | Attending: Urology | Admitting: Urology

## 2020-07-22 ENCOUNTER — Other Ambulatory Visit: Payer: Self-pay

## 2020-07-22 ENCOUNTER — Ambulatory Visit (INDEPENDENT_AMBULATORY_CARE_PROVIDER_SITE_OTHER): Payer: Medicare Other | Admitting: Urology

## 2020-07-22 VITALS — BP 127/72 | HR 75 | Temp 98.6°F | Ht 72.0 in | Wt 239.0 lb

## 2020-07-22 DIAGNOSIS — R3912 Poor urinary stream: Secondary | ICD-10-CM

## 2020-07-22 DIAGNOSIS — Z981 Arthrodesis status: Secondary | ICD-10-CM | POA: Diagnosis not present

## 2020-07-22 DIAGNOSIS — R350 Frequency of micturition: Secondary | ICD-10-CM | POA: Diagnosis not present

## 2020-07-22 DIAGNOSIS — N200 Calculus of kidney: Secondary | ICD-10-CM | POA: Diagnosis not present

## 2020-07-22 DIAGNOSIS — Z87442 Personal history of urinary calculi: Secondary | ICD-10-CM | POA: Diagnosis not present

## 2020-07-22 DIAGNOSIS — R351 Nocturia: Secondary | ICD-10-CM

## 2020-07-22 DIAGNOSIS — N132 Hydronephrosis with renal and ureteral calculous obstruction: Secondary | ICD-10-CM | POA: Diagnosis not present

## 2020-07-22 LAB — URINALYSIS, ROUTINE W REFLEX MICROSCOPIC
Bilirubin, UA: NEGATIVE
Glucose, UA: NEGATIVE
Ketones, UA: NEGATIVE
Leukocytes,UA: NEGATIVE
Nitrite, UA: NEGATIVE
Protein,UA: NEGATIVE
Specific Gravity, UA: 1.015 (ref 1.005–1.030)
Urobilinogen, Ur: 0.2 mg/dL (ref 0.2–1.0)
pH, UA: 7 (ref 5.0–7.5)

## 2020-07-22 LAB — MICROSCOPIC EXAMINATION
Bacteria, UA: NONE SEEN
Epithelial Cells (non renal): NONE SEEN /hpf (ref 0–10)
RBC, Urine: >30 /hpf — AB (ref 0–2)
Renal Epithel, UA: NONE SEEN /hpf
WBC, UA: NONE SEEN /hpf (ref 0–5)

## 2020-07-22 NOTE — Progress Notes (Signed)
Urological Symptom Review  Patient is experiencing the following symptoms: Get up at night to urinate Leakage of urine Have to strain to urinate Blood in urine Weak stream   Review of Systems  Gastrointestinal (upper)  : Negative for upper GI symptoms  Gastrointestinal (lower) : Negative for lower GI symptoms  Constitutional : Negative for symptoms  Skin: Negative for skin symptoms  Eyes: Negative for eye symptoms  Ear/Nose/Throat : Negative for Ear/Nose/Throat symptoms  Hematologic/Lymphatic: Negative for Hematologic/Lymphatic symptoms  Cardiovascular : Negative for cardiovascular symptoms  Respiratory : Negative for respiratory symptoms  Endocrine: Negative for endocrine symptoms  Musculoskeletal: Back pain  Neurological: Negative for neurological symptoms  Psychologic: Negative for psychiatric symptoms

## 2020-07-22 NOTE — Progress Notes (Signed)
Subjective: 1. Ureteral stone with hydronephrosis   2. Urinary frequency   3. Nocturia   4. Weak urinary stream      Consult requested by Colvin Caroli.   Brian Doyle is a 60 yo male who was in the [facility] ER on Sunday for right flank pain that has been present to some degree for 3 weeks.  He had some nausea.  He had some hematuria but no increased irritative symptoms.   He has chronic nocturia 2-3x.  He has had 2 prior stone extractions by Dr. [place] Pink.  He had a CT that showed a 5 mm stone in the mid ureter on the right.  The density was about 170-220 HU and he states he has had uric acid stones in the past.  A KUB today shows no obvious stones.  He is on chronic hydrocodone for his back.  He was started on tamsulosin.   He passes several stones a year.   UA today has >30.  I have reviewed the CT and KUB films.  His IPSS is 23 but his symptoms could be contributed to if the stone has moved distally.  ROS:  ROS  Allergies  Allergen Reactions  . Ketamine Hcl     Pt prefers not to have ever again  . Morphine And Related     cramps  . Nsaids     cramps    Past Medical History:  Diagnosis Date  . Chronic lower back pain   . GERD (gastroesophageal reflux disease)   . Hiatal hernia   . HLD (hyperlipidemia)   . Hypertriglyceridemia   . Neuropathy   . Pre-diabetes     Past Surgical History:  Procedure Laterality Date  . BIOPSY  01/14/2019   Procedure: BIOPSY;  Surgeon: Danie Binder, MD;  [facility];  Service: Endoscopy;;  gastric esophagus    . COLONOSCOPY  10/16/2013   [facility] with Dr. Arlester Marker; moderate sedation; normal colonoscopy.  . ELBOW SURGERY Left   . ESOPHAGOGASTRODUODENOSCOPY    . ESOPHAGOGASTRODUODENOSCOPY (EGD) WITH PROPOFOL N/A 01/14/2019   Procedure: ESOPHAGOGASTRODUODENOSCOPY (EGD) WITH PROPOFOL;  Surgeon: Danie Binder, MD;  [facility];  Service: Endoscopy;  Laterality: N/A;  2:45pm  . lower back surgery     x 2  (March 2010; Feb 2011)  . POLYPECTOMY  01/14/2019   Procedure: POLYPECTOMY;  Surgeon: Danie Binder, MD;  [facility];  Service: Endoscopy;;  gastric  . SAVORY DILATION N/A 01/14/2019   Procedure: SAVORY DILATION;  Surgeon: Danie Binder, MD;  [facility];  Service: Endoscopy;  Laterality: N/A;  . SHOULDER SURGERY Left   . vasectomy      Social History   Socioeconomic History  . Marital status: Married    Spouse name: Not on file  . Number of children: Not on file  . Years of education: Not on file  . Highest education level: Not on file  Occupational History  . Occupation: retired  Tobacco Use  . Smoking status: Former Smoker    Types: Cigarettes  . Smokeless tobacco: Never Used  Vaping Use  . Vaping Use: Never used  Substance and Sexual Activity  . Alcohol use: Yes    Comment: occas  . Drug use: Never  . Sexual activity: Yes  Other Topics Concern  . Not on file  Social History Narrative  . Not on file   Social Determinants of Health   Financial Resource Strain: Not  on file  Food Insecurity: Not on file  Transportation Needs: Not on file  Physical Activity: Not on file  Stress: Not on file  Social Connections: Not on file  Intimate Partner Violence: Not on file    Family History  Problem Relation Age of Onset  . Lung cancer Mother   . Colon cancer Neg Hx   . Colon polyps Neg Hx     Anti-infectives: Anti-infectives (From admission, onward)   None      Current Outpatient Medications  Medication Sig Dispense Refill  . baclofen (LIORESAL) 10 MG tablet 1 PO 30 MINS PRIOR TO first meal and at bedtime 60 each 3  . gabapentin (NEURONTIN) 300 MG capsule Take 900 mg by mouth 3 (three) times daily.    Marland Kitchen HYDROcodone-acetaminophen (NORCO/VICODIN) 5-325 MG tablet Take 1 tablet by mouth 3 (three) times daily.    . Multiple Vitamin (MULTIVITAMIN) capsule Take 1 capsule by mouth daily.    Marland Kitchen omeprazole (PRILOSEC) 20 MG capsule Take 20 mg by  mouth 2 (two) times daily before a meal.    . rosuvastatin (CRESTOR) 5 MG tablet Take 5 mg by mouth 3 (three) times a week.     . tamsulosin (FLOMAX) 0.4 MG CAPS capsule Take by mouth.     No current facility-administered medications for this visit.     Objective: Vital signs in last 24 hours: BP 127/72   Pulse 75   Temp 98.6 F (37 C)   Ht 6' (1.829 m)   Wt 239 lb (108.4 kg)   BMI 32.41 kg/m   Intake/Output from previous day: No intake/output data recorded. Intake/Output this shift: @IOTHISSHIFT @   Physical Exam Vitals reviewed.  Constitutional:      Appearance: Normal appearance.  Cardiovascular:     Rate and Rhythm: Normal rate and regular rhythm.     Heart sounds: Normal heart sounds.  Pulmonary:     Effort: Pulmonary effort is normal. No respiratory distress.     Breath sounds: Normal breath sounds.  Abdominal:     General: Abdomen is flat.     Palpations: Abdomen is soft.     Tenderness: There is no abdominal tenderness. There is no right CVA tenderness or left CVA tenderness.  Musculoskeletal:        General: No swelling. Normal range of motion.  Skin:    General: Skin is warm and dry.  Neurological:     General: No focal deficit present.     Mental Status: He is alert and oriented to person, place, and time.  Psychiatric:        Mood and Affect: Mood normal.        Behavior: Behavior normal.     Lab Results:  Results for orders placed or performed in visit on 07/22/20 (from the past 24 hour(s))  Urinalysis, Routine w reflex microscopic     Status: Abnormal   Collection Time: 07/22/20  1:48 PM  Result Value Ref Range   Specific Gravity, UA 1.015 1.005 - 1.030   pH, UA 7.0 5.0 - 7.5   Color, UA Yellow Yellow   Appearance Ur Clear Clear   Leukocytes,UA Negative Negative   Protein,UA Negative Negative/Trace   Glucose, UA Negative Negative   Ketones, UA Negative Negative   RBC, UA 3+ (A) Negative   Bilirubin, UA Negative Negative   Urobilinogen,  Ur 0.2 0.2 - 1.0 mg/dL   Nitrite, UA Negative Negative   Microscopic Examination See below:    Narrative  Performed at:  [facility] [address], [place], [place]  [id number] Lab Director: Mina Marble [place], Phone:  [phone]  Microscopic Examination     Status: Abnormal   Collection Time: 07/22/20  1:48 PM   Urine  Result Value Ref Range   WBC, UA None seen 0 - 5 /hpf   RBC >30 (A) 0 - 2 /hpf   Epithelial Cells (non renal) None seen 0 - 10 /hpf   Renal Epithel, UA None seen None seen /hpf   Mucus, UA Present Not Estab.   Bacteria, UA None seen None seen/Few   Narrative   Performed at:  [facility] [address] [place], [place]  [id number] Lab Director: [place], Phone:  [phone]    BMET No results for input(s): NA, K, CL, CO2, GLUCOSE, BUN, CREATININE, CALCIUM in the last 72 hours. PT/INR No results for input(s): LABPROT, INR in the last 72 hours. ABG No results for input(s): PHART, HCO3 in the last 72 hours.  Invalid input(s): PCO2, PO2  Studies/Results: CT and KUB films and reports reviewed.    Assessment/Plan: Right mid ureteral stone.   He has had uric acid stones in the past and I don't see the stone on KUB.  He is interested in trying to pass the stone and will return in month with a renal US.  If he passes the stone, we can get it analyzed and cancel the [place].  LUTS.   He has moderate-severe LUTS and that may need to be addressed if they persist after the stone episode has resolved.   No orders of the defined types were placed in this encounter.    Orders Placed This Encounter  Procedures  . Microscopic Examination  . US RENAL    Standing Status:   Future    Standing Expiration Date:   09/21/2020    Order Specific Question:   Reason for Exam (SYMPTOM  OR DIAGNOSIS REQUIRED)    Answer:   ureteral stone    Order Specific Question:   Preferred imaging location?    Answer:   [facility]  . Urinalysis,  Routine w reflex microscopic     Return in about 1 month (around 08/21/2020) for with Renal US.    CC: Dr. Colvin Caroli and Dr. Gar Ponto.      Irine Seal 07/22/2020 [phone] ID: Allyn Kenner, male   DOB: 1960-10-18, 60 y.o.   MRN: [id number]

## 2020-07-29 ENCOUNTER — Other Ambulatory Visit: Payer: Self-pay | Admitting: Urology

## 2020-07-29 ENCOUNTER — Telehealth: Payer: Self-pay

## 2020-07-29 DIAGNOSIS — N132 Hydronephrosis with renal and ureteral calculous obstruction: Secondary | ICD-10-CM

## 2020-07-29 MED ORDER — TAMSULOSIN HCL 0.4 MG PO CAPS: 0.4000 mg | ORAL_CAPSULE | Freq: Every day | ORAL | 1 refills | Status: DC

## 2020-07-29 NOTE — Progress Notes (Signed)
He continues to have some low grade discomfort and voiding symptoms with his stone.  I discussed options and will send a refill of tamsulosin and he will get the renal US on 5/12 as scheduled.  If there is persistent hydro, we can proceed with ureteroscopy.

## 2020-07-29 NOTE — Telephone Encounter (Signed)
Does, Dr. Alyson Ingles have any time available in the OR in the near future?  I am full this week and next and will be out of town from Thursday to my return to the office on Weds.   If he doesn't I could see if Dr. Abner Greenspan could do him tomorrow in [place] since he is on call.

## 2020-07-29 NOTE — Telephone Encounter (Signed)
Ok.  I talked to him and refilled the tamsulosin and he will stick with the plan for a renal US on 5/12 for now.

## 2020-07-29 NOTE — Telephone Encounter (Signed)
Patient called in this am stating his kidney stone pain has increased and he would like to ask Dr. If he can proceed with surgical intervention for the stone instead of waiting to see if it will pass on its on.  Message sent to MD

## 2020-08-05 ENCOUNTER — Ambulatory Visit (HOSPITAL_COMMUNITY): Payer: Medicare Other

## 2020-08-12 ENCOUNTER — Ambulatory Visit: Payer: Medicare Other | Admitting: Urology

## 2020-08-12 ENCOUNTER — Encounter: Payer: Self-pay | Admitting: Urology

## 2020-08-12 ENCOUNTER — Other Ambulatory Visit: Payer: Self-pay

## 2020-08-12 VITALS — BP 133/71 | HR 56 | Temp 97.9°F | Ht 72.0 in | Wt 239.0 lb

## 2020-08-12 DIAGNOSIS — R3912 Poor urinary stream: Secondary | ICD-10-CM

## 2020-08-12 DIAGNOSIS — N200 Calculus of kidney: Secondary | ICD-10-CM | POA: Diagnosis not present

## 2020-08-12 DIAGNOSIS — R351 Nocturia: Secondary | ICD-10-CM | POA: Diagnosis not present

## 2020-08-12 DIAGNOSIS — N132 Hydronephrosis with renal and ureteral calculous obstruction: Secondary | ICD-10-CM | POA: Diagnosis not present

## 2020-08-12 LAB — URINALYSIS, ROUTINE W REFLEX MICROSCOPIC
Bilirubin, UA: NEGATIVE
Glucose, UA: NEGATIVE
Ketones, UA: NEGATIVE
Leukocytes,UA: NEGATIVE
Nitrite, UA: NEGATIVE
Protein,UA: NEGATIVE
RBC, UA: NEGATIVE
Specific Gravity, UA: 1.02 (ref 1.005–1.030)
Urobilinogen, Ur: 0.2 mg/dL (ref 0.2–1.0)
pH, UA: 7 (ref 5.0–7.5)

## 2020-08-12 NOTE — Progress Notes (Signed)
Urological Symptom Review  Patient is experiencing the following symptoms: Get up at night to urinate Leakage of urine Trouble starting stream Have to strain to urinate Weak stream  Kidney stones  Review of Systems  Gastrointestinal (upper)  : Indigestion/heartburn  Gastrointestinal (lower) : Negative for lower GI symptoms  Constitutional : Negative for symptoms  Skin: Negative for skin symptoms  Eyes: Negative for eye symptoms  Ear/Nose/Throat : Negative for Ear/Nose/Throat symptoms  Hematologic/Lymphatic: Negative for Hematologic/Lymphatic symptoms  Cardiovascular : Negative for cardiovascular symptoms  Respiratory : Negative for respiratory symptoms  Endocrine: Negative for endocrine symptoms  Musculoskeletal: Back pain  Neurological: Negative for neurological symptoms  Psychologic: Negative for psychiatric symptoms

## 2020-08-12 NOTE — Progress Notes (Signed)
Subjective: 1. Ureteral stone with hydronephrosis   2. Left renal stone   3. Nocturia   4. Weak urinary stream    08/12/20: Brian Doyle passed the right ureteral stone about 2 Sundays ago but didn't bring it in.  He now has some left flank pain that started today.  He has brown urine and some left sided pain with radiation into the testicle.  He had tiny left renal stones on his recent CT.  His voiding symptoms have improved.  His IPSS is 16 and his UA is clear.      07/22/20: Brian Doyle is a 60 yo male who was in the [facility] ER on Sunday for right flank pain that has been present to some degree for 3 weeks.  He had some nausea.  He had some hematuria but no increased irritative symptoms.   He has chronic nocturia 2-3x.  He has had 2 prior stone extractions by Dr. Maryland Pink.  He had a CT that showed a 5 mm stone in the mid ureter on the right.  The density was about 170-220 HU and he states he has had uric acid stones in the past.  A KUB today shows no obvious stones.  He is on chronic hydrocodone for his back.  He was started on tamsulosin.   He passes several stones a year.   UA today has >30.  I have reviewed the CT and KUB films.  His IPSS is 23 but his symptoms could be contributed to if the stone has moved distally.  ROS:  ROS  Allergies  Allergen Reactions  . Ketamine Hcl     Pt prefers not to have ever again  . Morphine And Related     cramps  . Nsaids     cramps    Past Medical History:  Diagnosis Date  . Chronic lower back pain   . GERD (gastroesophageal reflux disease)   . Hiatal hernia   . HLD (hyperlipidemia)   . Hypertriglyceridemia   . Neuropathy   . Pre-diabetes     Past Surgical History:  Procedure Laterality Date  . BIOPSY  01/14/2019   Procedure: BIOPSY;  Surgeon: Danie Binder, MD;  [facility];  Service: Endoscopy;;  gastric esophagus    . COLONOSCOPY  10/16/2013   [facility] with Dr. Arlester Marker; moderate sedation; normal colonoscopy.  .  ELBOW SURGERY Left   . ESOPHAGOGASTRODUODENOSCOPY    . ESOPHAGOGASTRODUODENOSCOPY (EGD) WITH PROPOFOL N/A 01/14/2019   Procedure: ESOPHAGOGASTRODUODENOSCOPY (EGD) WITH PROPOFOL;  Surgeon: Danie Binder, MD;  [facility];  Service: Endoscopy;  Laterality: N/A;  2:45pm  . lower back surgery     x 2 (March 2010; Feb 2011)  . POLYPECTOMY  01/14/2019   Procedure: POLYPECTOMY;  Surgeon: Danie Binder, MD;  [facility];  Service: Endoscopy;;  gastric  . SAVORY DILATION N/A 01/14/2019   Procedure: SAVORY DILATION;  Surgeon: Danie Binder, MD;  [facility];  Service: Endoscopy;  Laterality: N/A;  . SHOULDER SURGERY Left   . vasectomy      Social History   Socioeconomic History  . Marital status: Married    Spouse name: Not on file  . Number of children: Not on file  . Years of education: Not on file  . Highest education level: Not on file  Occupational History  . Occupation: retired  Tobacco Use  . Smoking status: Former Smoker    Types: Cigarettes  . Smokeless  tobacco: Never Used  Vaping Use  . Vaping Use: Never used  Substance and Sexual Activity  . Alcohol use: Yes    Comment: occas  . Drug use: Never  . Sexual activity: Yes  Other Topics Concern  . Not on file  Social History Narrative  . Not on file   Social Determinants of Health   Financial Resource Strain: Not on file  Food Insecurity: Not on file  Transportation Needs: Not on file  Physical Activity: Not on file  Stress: Not on file  Social Connections: Not on file  Intimate Partner Violence: Not on file    Family History  Problem Relation Age of Onset  . Lung cancer Mother   . Colon cancer Neg Hx   . Colon polyps Neg Hx     Anti-infectives: Anti-infectives (From admission, onward)   None      Current Outpatient Medications  Medication Sig Dispense Refill  . baclofen (LIORESAL) 10 MG tablet 1 PO 30 MINS PRIOR TO first meal and at bedtime 60 each 3  .  gabapentin (NEURONTIN) 300 MG capsule Take 900 mg by mouth 3 (three) times daily.    Marland Kitchen HYDROcodone-acetaminophen (NORCO/VICODIN) 5-325 MG tablet Take 1 tablet by mouth 3 (three) times daily.    . Multiple Vitamin (MULTIVITAMIN) capsule Take 1 capsule by mouth daily.    Marland Kitchen omeprazole (PRILOSEC) 20 MG capsule Take 20 mg by mouth 2 (two) times daily before a meal.    . rosuvastatin (CRESTOR) 5 MG tablet Take 5 mg by mouth 3 (three) times a week.     . tamsulosin (FLOMAX) 0.4 MG CAPS capsule Take 1 capsule (0.4 mg total) by mouth daily. 30 capsule 1   No current facility-administered medications for this visit.     Objective: Vital signs in last 24 hours: BP 133/71   Pulse (!) 56   Temp 97.9 F (36.6 C)   Ht 6' (1.829 m)   Wt 239 lb (108.4 kg)   BMI 32.41 kg/m   Intake/Output from previous day: No intake/output data recorded. Intake/Output this shift: @IOTHISSHIFT @   Physical Exam  Lab Results:  Results for orders placed or performed in visit on 08/12/20 (from the past 24 hour(s))  Urinalysis, Routine w reflex microscopic     Status: None   Collection Time: 08/12/20  1:33 PM  Result Value Ref Range   Specific Gravity, UA 1.020 1.005 - 1.030   pH, UA 7.0 5.0 - 7.5   Color, UA Yellow Yellow   Appearance Ur Clear Clear   Leukocytes,UA Negative Negative   Protein,UA Negative Negative/Trace   Glucose, UA Negative Negative   Ketones, UA Negative Negative   RBC, UA Negative Negative   Bilirubin, UA Negative Negative   Urobilinogen, Ur 0.2 0.2 - 1.0 mg/dL   Nitrite, UA Negative Negative   Microscopic Examination Comment    Narrative   Performed at:  [facility] [address], [place], [place]  [id number] Lab Director: [place], Phone:  [phone]    BMET No results for input(s): NA, K, CL, CO2, GLUCOSE, BUN, CREATININE, CALCIUM in the last 72 hours. PT/INR No results for input(s): LABPROT, INR in the last 72 hours. ABG No results for input(s):  PHART, HCO3 in the last 72 hours.  Invalid input(s): PCO2, PO2 UA is clear.  Studies/Results: Prior CT reviewed.   Assessment/Plan: Right mid ureteral stone.   He has passed the stone and will bring it in for analysis.  Left flank pain with left renal stone.  The left renal stones were punctate on his recent CT.  His UA is clear.  He will resume tamsulosin and let me know if his symptoms don't resolve.  Otherwise I will get a renal US in 6 months.   LUTS.   He has had improved in the voiding symptoms since passing the stone.   No orders of the defined types were placed in this encounter.    Orders Placed This Encounter  Procedures  . US RENAL    Standing Status:   Future    Standing Expiration Date:   08/12/2021    Order Specific Question:   Reason for Exam (SYMPTOM  OR DIAGNOSIS REQUIRED)    Answer:   renal stones    Order Specific Question:   Preferred imaging location?    Answer:   [facility]  . Urinalysis, Routine w reflex microscopic  . Calculi, with Photograph (to Clinical Lab)    He will bring the stone in.    Standing Status:   Future    Standing Expiration Date:   08/12/2021     Return in about 6 months (around 02/12/2021) for with renal US..    CC: Dr. Colvin Caroli and Dr. Gar Ponto.      Irine Seal 08/12/2020 [phone] ID: Brian Doyle, male   DOB: 14-Feb-1961, 60 y.o.   MRN: [id number] Patient ID: Brian Doyle, male   DOB: [birth year], 60 y.o.   MRN: [id number]

## 2020-08-19 ENCOUNTER — Encounter (HOSPITAL_COMMUNITY): Payer: Self-pay

## 2020-08-19 ENCOUNTER — Ambulatory Visit (HOSPITAL_COMMUNITY): Admission: RE | Admit: 2020-08-19 | Payer: Medicare Other | Source: Ambulatory Visit

## 2020-09-07 DIAGNOSIS — G8929 Other chronic pain: Secondary | ICD-10-CM | POA: Diagnosis not present

## 2020-09-07 DIAGNOSIS — M79645 Pain in left finger(s): Secondary | ICD-10-CM | POA: Diagnosis not present

## 2020-09-07 DIAGNOSIS — M7061 Trochanteric bursitis, right hip: Secondary | ICD-10-CM | POA: Diagnosis not present

## 2020-09-07 DIAGNOSIS — M25512 Pain in left shoulder: Secondary | ICD-10-CM | POA: Diagnosis not present

## 2020-09-23 ENCOUNTER — Other Ambulatory Visit: Payer: Self-pay

## 2020-09-23 ENCOUNTER — Ambulatory Visit (INDEPENDENT_AMBULATORY_CARE_PROVIDER_SITE_OTHER): Payer: Medicare Other | Admitting: Urology

## 2020-09-23 ENCOUNTER — Encounter: Payer: Self-pay | Admitting: Urology

## 2020-09-23 VITALS — BP 147/85 | HR 75

## 2020-09-23 DIAGNOSIS — N200 Calculus of kidney: Secondary | ICD-10-CM

## 2020-09-23 DIAGNOSIS — M5416 Radiculopathy, lumbar region: Secondary | ICD-10-CM | POA: Diagnosis not present

## 2020-09-23 DIAGNOSIS — N5201 Erectile dysfunction due to arterial insufficiency: Secondary | ICD-10-CM

## 2020-09-23 DIAGNOSIS — M961 Postlaminectomy syndrome, not elsewhere classified: Secondary | ICD-10-CM | POA: Diagnosis not present

## 2020-09-23 MED ORDER — TAMSULOSIN HCL 0.4 MG PO CAPS: 0.4000 mg | ORAL_CAPSULE | Freq: Every day | ORAL | 11 refills | Status: AC

## 2020-09-23 MED ORDER — TADALAFIL 20 MG PO TABS: 20.0000 mg | ORAL_TABLET | Freq: Every day | ORAL | 5 refills | Status: AC | PRN

## 2020-09-23 NOTE — Progress Notes (Signed)

## 2020-09-23 NOTE — Progress Notes (Signed)
Subjective: 1. Kidney stones   2. Erectile dysfunction due to arterial insufficiency     09/23/20: Brian Doyle returns today in f/u for erectile dysfunction and a possible stone.  He has some penile discomfort with burning and pricking sensations consistent with a distal stones.   He has progressive difficulty with his erections.  In 2003 he was started on lipitor and had issues.  That med was stopped.  He is now on rosuvastatin.  He is unable to get any tumsecence and has not had intercourse in months.   He had been given cialis in 2003 which helps..  He remains on hydrocodone which he has been taking for 12 years since back surgery.  He has a good libido.  He has no atherosclerosis on his CT from 4/22, but he has hypertriglyceridemia.  08/12/20: Brian Doyle passed the right ureteral stone about 2 Sundays ago but didn't bring it in.  He now has some left flank pain that started today.  He has brown urine and some left sided pain with radiation into the testicle.  He had tiny left renal stones on his recent CT.  His voiding symptoms have improved.  His IPSS is 16 and his UA is clear.      07/22/20: Brian Doyle is a 60 yo male who was in the [facility] ER on Sunday for right flank pain that has been present to some degree for 3 weeks.  He had some nausea.  He had some hematuria but no increased irritative symptoms.   He has chronic nocturia 2-3x.  He has had 2 prior stone extractions by Dr. [place] Pink.  He had a CT that showed a 5 mm stone in the mid ureter on the right.  The density was about 170-220 HU and he states he has had uric acid stones in the past.  A KUB today shows no obvious stones.  He is on chronic hydrocodone for his back.  He was started on tamsulosin.   He passes several stones a year.   UA today has >30.  I have reviewed the CT and KUB films.  His IPSS is 23 but his symptoms could be contributed to if the stone has moved distally.  ROS:  ROS  Allergies  Allergen Reactions   Ketamine Hcl     Pt prefers not  to have ever again   Morphine And Related     cramps   Nsaids     cramps    Past Medical History:  Diagnosis Date   Chronic lower back pain    GERD (gastroesophageal reflux disease)    Hiatal hernia    HLD (hyperlipidemia)    Hypertriglyceridemia    Neuropathy    Pre-diabetes     Past Surgical History:  Procedure Laterality Date   BIOPSY  01/14/2019   Procedure: BIOPSY;  Surgeon: Danie Binder, MD;  [facility];  Service: Endoscopy;;  gastric esophagus     COLONOSCOPY  10/16/2013   [facility] with Dr. Arlester Marker; moderate sedation; normal colonoscopy.   ELBOW SURGERY Left    ESOPHAGOGASTRODUODENOSCOPY     ESOPHAGOGASTRODUODENOSCOPY (EGD) WITH PROPOFOL N/A 01/14/2019   Procedure: ESOPHAGOGASTRODUODENOSCOPY (EGD) WITH PROPOFOL;  Surgeon: Danie Binder, MD;  [facility]: [facility];  Service: Endoscopy;  Laterality: N/A;  2:45pm   lower back surgery     x 2 (March 2010; Feb 2011)   POLYPECTOMY  01/14/2019   Procedure: POLYPECTOMY;  Surgeon: Danie Binder, MD;  [facility];  Service: Endoscopy;;  gastric   SAVORY DILATION N/A 01/14/2019   Procedure: SAVORY DILATION;  Surgeon: Danie Binder, MD;  [facility]: [facility];  Service: Endoscopy;  Laterality: N/A;   SHOULDER SURGERY Left    vasectomy      Social History   Socioeconomic History   Marital status: Married    Spouse name: Not on file   Number of children: Not on file   Years of education: Not on file   Highest education level: Not on file  Occupational History   Occupation: retired  Tobacco Use   Smoking status: Former    Pack years: 0.00    Types: Cigarettes   Smokeless tobacco: Never  Vaping Use   Vaping Use: Never used  Substance and Sexual Activity   Alcohol use: Yes    Comment: occas   Drug use: Never   Sexual activity: Yes  Other Topics Concern   Not on file  Social History Narrative   Not on file   Social Determinants of Health   Financial  Resource Strain: Not on file  Food Insecurity: Not on file  Transportation Needs: Not on file  Physical Activity: Not on file  Stress: Not on file  Social Connections: Not on file  Intimate Partner Violence: Not on file    Family History  Problem Relation Age of Onset   Lung cancer Mother    Colon cancer Neg Hx    Colon polyps Neg Hx     Anti-infectives: Anti-infectives (From admission, onward)    None       Current Outpatient Medications  Medication Sig Dispense Refill   baclofen (LIORESAL) 10 MG tablet 1 PO 30 MINS PRIOR TO first meal and at bedtime 60 each 3   gabapentin (NEURONTIN) 300 MG capsule Take 900 mg by mouth 3 (three) times daily.     HYDROcodone-acetaminophen (NORCO/VICODIN) 5-325 MG tablet Take 1 tablet by mouth 3 (three) times daily.     Multiple Vitamin (MULTIVITAMIN) capsule Take 1 capsule by mouth daily.     omeprazole (PRILOSEC) 20 MG capsule Take 20 mg by mouth 2 (two) times daily before a meal.     rosuvastatin (CRESTOR) 5 MG tablet Take 5 mg by mouth 3 (three) times a week.      tadalafil (CIALIS) 20 MG tablet Take 1 tablet (20 mg total) by mouth daily as needed for erectile dysfunction. 30 tablet 5   tamsulosin (FLOMAX) 0.4 MG CAPS capsule Take 1 capsule (0.4 mg total) by mouth daily. 30 capsule 11   No current facility-administered medications for this visit.     Objective: Vital signs in last 24 hours: BP (!) 147/85   Pulse 75   Intake/Output from previous day: No intake/output data recorded. Intake/Output this shift: @IOTHISSHIFT @   Physical Exam Vitals reviewed.  Constitutional:      Appearance: Normal appearance.  Genitourinary:    Comments: Nl phallus with an adequate meatus. Scrotum is normal. Testes are without mass or atrophy. Epididymis are normal.  Neurological:     Mental Status: He is alert.    Lab Results:  No results found for this or any previous visit (from the past 24 hour(s)).   BMET No results for input(s):  NA, K, CL, CO2, GLUCOSE, BUN, CREATININE, CALCIUM in the last 72 hours. PT/INR No results for input(s): LABPROT, INR in the last 72 hours. ABG No results for input(s): PHART, HCO3 in the last 72 hours.  Invalid input(s): PCO2, PO2  Studies/Results: Prior CT reviewed.  He has no atherosclerosis.    Assessment/Plan: ED.  He has probably vasculogenic ED but is on chronic opioids for back pain so I will check a testosterone level.  I am going to have him try tadalafil 20mg  and reviewed the side effects and precautions.   Uric acid urolithiasis.  He may be passing a stone but his symptoms aren't bad.   LUTS.   I have refilled the tamsulosin.   Meds ordered this encounter  Medications   tadalafil (CIALIS) 20 MG tablet    Sig: Take 1 tablet (20 mg total) by mouth daily as needed for erectile dysfunction.    Dispense:  30 tablet    Refill:  5   tamsulosin (FLOMAX) 0.4 MG CAPS capsule    Sig: Take 1 capsule (0.4 mg total) by mouth daily.    Dispense:  30 capsule    Refill:  11     Orders Placed This Encounter  Procedures   Testosterone,Free and Total     Return in about 1 year (around 09/23/2021).    CC: Dr. Colvin Caroli and Dr. Gar Ponto.      Irine Seal 09/23/2020 [phone] ID: Brian Doyle, male   DOB: 02-21-1961, 60 y.o.   MRN: [id number] Patient ID: Brian Doyle, male   DOB: November 28, 1960, 60 y.o.   MRN: [id number]

## 2020-09-26 LAB — TESTOSTERONE,FREE AND TOTAL
Testosterone, Free: 5.4 pg/mL — ABNORMAL LOW (ref 7.2–24.0)
Testosterone: 286 ng/dL (ref 264–916)

## 2020-10-27 DIAGNOSIS — M7711 Lateral epicondylitis, right elbow: Secondary | ICD-10-CM | POA: Diagnosis not present

## 2020-10-27 DIAGNOSIS — M7541 Impingement syndrome of right shoulder: Secondary | ICD-10-CM | POA: Diagnosis not present

## 2020-11-24 DIAGNOSIS — M7711 Lateral epicondylitis, right elbow: Secondary | ICD-10-CM | POA: Diagnosis not present

## 2020-11-24 DIAGNOSIS — K219 Gastro-esophageal reflux disease without esophagitis: Secondary | ICD-10-CM | POA: Diagnosis not present

## 2020-11-24 DIAGNOSIS — M7541 Impingement syndrome of right shoulder: Secondary | ICD-10-CM | POA: Diagnosis not present

## 2020-11-24 DIAGNOSIS — E782 Mixed hyperlipidemia: Secondary | ICD-10-CM | POA: Diagnosis not present

## 2020-12-01 DIAGNOSIS — E782 Mixed hyperlipidemia: Secondary | ICD-10-CM | POA: Diagnosis not present

## 2020-12-01 DIAGNOSIS — M545 Low back pain, unspecified: Secondary | ICD-10-CM | POA: Diagnosis not present

## 2020-12-01 DIAGNOSIS — E7849 Other hyperlipidemia: Secondary | ICD-10-CM | POA: Diagnosis not present

## 2020-12-01 DIAGNOSIS — K219 Gastro-esophageal reflux disease without esophagitis: Secondary | ICD-10-CM | POA: Diagnosis not present

## 2020-12-01 DIAGNOSIS — C4490 Unspecified malignant neoplasm of skin, unspecified: Secondary | ICD-10-CM | POA: Diagnosis not present

## 2020-12-01 DIAGNOSIS — R7303 Prediabetes: Secondary | ICD-10-CM | POA: Diagnosis not present

## 2020-12-01 DIAGNOSIS — Z72820 Sleep deprivation: Secondary | ICD-10-CM | POA: Diagnosis not present

## 2020-12-01 DIAGNOSIS — Z1389 Encounter for screening for other disorder: Secondary | ICD-10-CM | POA: Diagnosis not present

## 2020-12-13 DIAGNOSIS — M7541 Impingement syndrome of right shoulder: Secondary | ICD-10-CM | POA: Diagnosis not present

## 2020-12-13 DIAGNOSIS — M7711 Lateral epicondylitis, right elbow: Secondary | ICD-10-CM | POA: Diagnosis not present

## 2020-12-14 DIAGNOSIS — C4490 Unspecified malignant neoplasm of skin, unspecified: Secondary | ICD-10-CM | POA: Diagnosis not present

## 2020-12-14 DIAGNOSIS — Z72820 Sleep deprivation: Secondary | ICD-10-CM | POA: Diagnosis not present

## 2020-12-14 DIAGNOSIS — K219 Gastro-esophageal reflux disease without esophagitis: Secondary | ICD-10-CM | POA: Diagnosis not present

## 2020-12-14 DIAGNOSIS — E7849 Other hyperlipidemia: Secondary | ICD-10-CM | POA: Diagnosis not present

## 2020-12-14 DIAGNOSIS — M545 Low back pain, unspecified: Secondary | ICD-10-CM | POA: Diagnosis not present

## 2020-12-14 DIAGNOSIS — Z23 Encounter for immunization: Secondary | ICD-10-CM | POA: Diagnosis not present

## 2020-12-14 DIAGNOSIS — R7303 Prediabetes: Secondary | ICD-10-CM | POA: Diagnosis not present

## 2020-12-28 DIAGNOSIS — M961 Postlaminectomy syndrome, not elsewhere classified: Secondary | ICD-10-CM | POA: Diagnosis not present

## 2020-12-28 DIAGNOSIS — R030 Elevated blood-pressure reading, without diagnosis of hypertension: Secondary | ICD-10-CM | POA: Diagnosis not present

## 2020-12-28 DIAGNOSIS — M5136 Other intervertebral disc degeneration, lumbar region: Secondary | ICD-10-CM | POA: Diagnosis not present

## 2021-02-03 ENCOUNTER — Other Ambulatory Visit (HOSPITAL_COMMUNITY): Payer: Medicare Other

## 2021-02-10 ENCOUNTER — Ambulatory Visit: Payer: Medicare Other | Admitting: Urology

## 2021-02-22 DIAGNOSIS — M7711 Lateral epicondylitis, right elbow: Secondary | ICD-10-CM | POA: Diagnosis not present

## 2021-03-08 DIAGNOSIS — R739 Hyperglycemia, unspecified: Secondary | ICD-10-CM | POA: Diagnosis not present

## 2021-03-08 DIAGNOSIS — M5136 Other intervertebral disc degeneration, lumbar region: Secondary | ICD-10-CM | POA: Diagnosis not present

## 2021-03-08 DIAGNOSIS — M961 Postlaminectomy syndrome, not elsewhere classified: Secondary | ICD-10-CM | POA: Diagnosis not present

## 2021-05-10 DIAGNOSIS — M25521 Pain in right elbow: Secondary | ICD-10-CM | POA: Diagnosis not present

## 2021-05-10 DIAGNOSIS — M25511 Pain in right shoulder: Secondary | ICD-10-CM | POA: Diagnosis not present

## 2021-05-12 DIAGNOSIS — M25521 Pain in right elbow: Secondary | ICD-10-CM | POA: Diagnosis not present

## 2021-05-26 DIAGNOSIS — M961 Postlaminectomy syndrome, not elsewhere classified: Secondary | ICD-10-CM | POA: Diagnosis not present

## 2021-05-26 DIAGNOSIS — M5136 Other intervertebral disc degeneration, lumbar region: Secondary | ICD-10-CM | POA: Diagnosis not present

## 2021-05-30 DIAGNOSIS — R739 Hyperglycemia, unspecified: Secondary | ICD-10-CM | POA: Diagnosis not present

## 2021-05-30 DIAGNOSIS — M7701 Medial epicondylitis, right elbow: Secondary | ICD-10-CM | POA: Diagnosis not present

## 2021-05-30 DIAGNOSIS — K219 Gastro-esophageal reflux disease without esophagitis: Secondary | ICD-10-CM | POA: Diagnosis not present

## 2021-05-30 DIAGNOSIS — Z1329 Encounter for screening for other suspected endocrine disorder: Secondary | ICD-10-CM | POA: Diagnosis not present

## 2021-05-30 DIAGNOSIS — E7800 Pure hypercholesterolemia, unspecified: Secondary | ICD-10-CM | POA: Diagnosis not present

## 2021-05-30 DIAGNOSIS — M66221 Spontaneous rupture of extensor tendons, right upper arm: Secondary | ICD-10-CM | POA: Diagnosis not present

## 2021-05-30 DIAGNOSIS — Z1321 Encounter for screening for nutritional disorder: Secondary | ICD-10-CM | POA: Diagnosis not present

## 2021-06-02 DIAGNOSIS — K219 Gastro-esophageal reflux disease without esophagitis: Secondary | ICD-10-CM | POA: Diagnosis not present

## 2021-06-02 DIAGNOSIS — Z0000 Encounter for general adult medical examination without abnormal findings: Secondary | ICD-10-CM | POA: Diagnosis not present

## 2021-06-02 DIAGNOSIS — S39012A Strain of muscle, fascia and tendon of lower back, initial encounter: Secondary | ICD-10-CM | POA: Diagnosis not present

## 2021-06-02 DIAGNOSIS — C4490 Unspecified malignant neoplasm of skin, unspecified: Secondary | ICD-10-CM | POA: Diagnosis not present

## 2021-06-02 DIAGNOSIS — R7303 Prediabetes: Secondary | ICD-10-CM | POA: Diagnosis not present

## 2021-06-02 DIAGNOSIS — Z72820 Sleep deprivation: Secondary | ICD-10-CM | POA: Diagnosis not present

## 2021-06-02 DIAGNOSIS — E7849 Other hyperlipidemia: Secondary | ICD-10-CM | POA: Diagnosis not present

## 2021-06-07 DIAGNOSIS — M7701 Medial epicondylitis, right elbow: Secondary | ICD-10-CM | POA: Diagnosis not present

## 2021-06-09 DIAGNOSIS — M6281 Muscle weakness (generalized): Secondary | ICD-10-CM | POA: Diagnosis not present

## 2021-06-09 DIAGNOSIS — Z4789 Encounter for other orthopedic aftercare: Secondary | ICD-10-CM | POA: Diagnosis not present

## 2021-06-09 DIAGNOSIS — M25521 Pain in right elbow: Secondary | ICD-10-CM | POA: Diagnosis not present

## 2021-06-15 DIAGNOSIS — M25521 Pain in right elbow: Secondary | ICD-10-CM | POA: Diagnosis not present

## 2021-06-15 DIAGNOSIS — Z4789 Encounter for other orthopedic aftercare: Secondary | ICD-10-CM | POA: Diagnosis not present

## 2021-06-15 DIAGNOSIS — M6281 Muscle weakness (generalized): Secondary | ICD-10-CM | POA: Diagnosis not present

## 2021-06-22 DIAGNOSIS — M6281 Muscle weakness (generalized): Secondary | ICD-10-CM | POA: Diagnosis not present

## 2021-06-22 DIAGNOSIS — Z4789 Encounter for other orthopedic aftercare: Secondary | ICD-10-CM | POA: Diagnosis not present

## 2021-06-22 DIAGNOSIS — M25521 Pain in right elbow: Secondary | ICD-10-CM | POA: Diagnosis not present

## 2021-06-28 DIAGNOSIS — M7701 Medial epicondylitis, right elbow: Secondary | ICD-10-CM | POA: Diagnosis not present

## 2021-07-19 DIAGNOSIS — M7701 Medial epicondylitis, right elbow: Secondary | ICD-10-CM | POA: Diagnosis not present

## 2021-07-20 DIAGNOSIS — Z4789 Encounter for other orthopedic aftercare: Secondary | ICD-10-CM | POA: Diagnosis not present

## 2021-07-20 DIAGNOSIS — M6281 Muscle weakness (generalized): Secondary | ICD-10-CM | POA: Diagnosis not present

## 2021-07-20 DIAGNOSIS — M25521 Pain in right elbow: Secondary | ICD-10-CM | POA: Diagnosis not present

## 2021-08-08 DIAGNOSIS — M5136 Other intervertebral disc degeneration, lumbar region: Secondary | ICD-10-CM | POA: Diagnosis not present

## 2021-08-08 DIAGNOSIS — M961 Postlaminectomy syndrome, not elsewhere classified: Secondary | ICD-10-CM | POA: Diagnosis not present

## 2021-08-16 DIAGNOSIS — M79671 Pain in right foot: Secondary | ICD-10-CM | POA: Diagnosis not present

## 2021-08-16 DIAGNOSIS — M7731 Calcaneal spur, right foot: Secondary | ICD-10-CM | POA: Diagnosis not present

## 2021-08-16 DIAGNOSIS — M722 Plantar fascial fibromatosis: Secondary | ICD-10-CM | POA: Diagnosis not present

## 2021-09-05 DIAGNOSIS — M79672 Pain in left foot: Secondary | ICD-10-CM | POA: Diagnosis not present

## 2021-09-05 DIAGNOSIS — M722 Plantar fascial fibromatosis: Secondary | ICD-10-CM | POA: Diagnosis not present

## 2021-09-05 DIAGNOSIS — E7800 Pure hypercholesterolemia, unspecified: Secondary | ICD-10-CM | POA: Diagnosis not present

## 2021-09-05 DIAGNOSIS — Z131 Encounter for screening for diabetes mellitus: Secondary | ICD-10-CM | POA: Diagnosis not present

## 2021-09-05 DIAGNOSIS — E782 Mixed hyperlipidemia: Secondary | ICD-10-CM | POA: Diagnosis not present

## 2021-09-05 DIAGNOSIS — K219 Gastro-esophageal reflux disease without esophagitis: Secondary | ICD-10-CM | POA: Diagnosis not present

## 2021-09-05 DIAGNOSIS — M79671 Pain in right foot: Secondary | ICD-10-CM | POA: Diagnosis not present

## 2021-09-06 DIAGNOSIS — M79642 Pain in left hand: Secondary | ICD-10-CM | POA: Diagnosis not present

## 2021-09-06 DIAGNOSIS — Z72820 Sleep deprivation: Secondary | ICD-10-CM | POA: Diagnosis not present

## 2021-09-06 DIAGNOSIS — K219 Gastro-esophageal reflux disease without esophagitis: Secondary | ICD-10-CM | POA: Diagnosis not present

## 2021-09-06 DIAGNOSIS — R7303 Prediabetes: Secondary | ICD-10-CM | POA: Diagnosis not present

## 2021-09-06 DIAGNOSIS — R030 Elevated blood-pressure reading, without diagnosis of hypertension: Secondary | ICD-10-CM | POA: Diagnosis not present

## 2021-09-06 DIAGNOSIS — C4490 Unspecified malignant neoplasm of skin, unspecified: Secondary | ICD-10-CM | POA: Diagnosis not present

## 2021-09-06 DIAGNOSIS — E7849 Other hyperlipidemia: Secondary | ICD-10-CM | POA: Diagnosis not present

## 2021-09-06 DIAGNOSIS — M545 Low back pain, unspecified: Secondary | ICD-10-CM | POA: Diagnosis not present

## 2021-09-12 DIAGNOSIS — H905 Unspecified sensorineural hearing loss: Secondary | ICD-10-CM | POA: Diagnosis not present

## 2021-09-20 DIAGNOSIS — M79671 Pain in right foot: Secondary | ICD-10-CM | POA: Diagnosis not present

## 2021-09-20 DIAGNOSIS — M7731 Calcaneal spur, right foot: Secondary | ICD-10-CM | POA: Diagnosis not present

## 2021-09-20 DIAGNOSIS — M722 Plantar fascial fibromatosis: Secondary | ICD-10-CM | POA: Diagnosis not present

## 2021-09-22 ENCOUNTER — Ambulatory Visit: Payer: Medicare Other | Admitting: Urology

## 2021-10-10 DIAGNOSIS — M7701 Medial epicondylitis, right elbow: Secondary | ICD-10-CM | POA: Diagnosis not present

## 2021-10-11 DIAGNOSIS — M25511 Pain in right shoulder: Secondary | ICD-10-CM | POA: Diagnosis not present

## 2021-10-20 DIAGNOSIS — M79671 Pain in right foot: Secondary | ICD-10-CM | POA: Diagnosis not present

## 2021-10-20 DIAGNOSIS — M7731 Calcaneal spur, right foot: Secondary | ICD-10-CM | POA: Diagnosis not present

## 2021-10-20 DIAGNOSIS — M722 Plantar fascial fibromatosis: Secondary | ICD-10-CM | POA: Diagnosis not present

## 2021-10-31 DIAGNOSIS — M961 Postlaminectomy syndrome, not elsewhere classified: Secondary | ICD-10-CM | POA: Diagnosis not present

## 2021-10-31 DIAGNOSIS — M5136 Other intervertebral disc degeneration, lumbar region: Secondary | ICD-10-CM | POA: Diagnosis not present

## 2021-11-21 DIAGNOSIS — M7521 Bicipital tendinitis, right shoulder: Secondary | ICD-10-CM | POA: Diagnosis not present

## 2021-11-21 DIAGNOSIS — G8918 Other acute postprocedural pain: Secondary | ICD-10-CM | POA: Diagnosis not present

## 2021-11-21 DIAGNOSIS — M75111 Incomplete rotator cuff tear or rupture of right shoulder, not specified as traumatic: Secondary | ICD-10-CM | POA: Diagnosis not present

## 2021-11-21 DIAGNOSIS — M24111 Other articular cartilage disorders, right shoulder: Secondary | ICD-10-CM | POA: Diagnosis not present

## 2021-11-21 DIAGNOSIS — M19011 Primary osteoarthritis, right shoulder: Secondary | ICD-10-CM | POA: Diagnosis not present

## 2021-11-21 DIAGNOSIS — S43431A Superior glenoid labrum lesion of right shoulder, initial encounter: Secondary | ICD-10-CM | POA: Diagnosis not present

## 2021-11-21 DIAGNOSIS — M7541 Impingement syndrome of right shoulder: Secondary | ICD-10-CM | POA: Diagnosis not present

## 2021-11-21 DIAGNOSIS — M958 Other specified acquired deformities of musculoskeletal system: Secondary | ICD-10-CM | POA: Diagnosis not present

## 2021-11-25 DIAGNOSIS — M75111 Incomplete rotator cuff tear or rupture of right shoulder, not specified as traumatic: Secondary | ICD-10-CM | POA: Diagnosis not present

## 2021-11-25 DIAGNOSIS — M6281 Muscle weakness (generalized): Secondary | ICD-10-CM | POA: Diagnosis not present

## 2021-11-25 DIAGNOSIS — M25611 Stiffness of right shoulder, not elsewhere classified: Secondary | ICD-10-CM | POA: Diagnosis not present

## 2021-11-25 DIAGNOSIS — M7521 Bicipital tendinitis, right shoulder: Secondary | ICD-10-CM | POA: Diagnosis not present

## 2021-11-29 DIAGNOSIS — M19011 Primary osteoarthritis, right shoulder: Secondary | ICD-10-CM | POA: Diagnosis not present

## 2021-11-30 DIAGNOSIS — M75111 Incomplete rotator cuff tear or rupture of right shoulder, not specified as traumatic: Secondary | ICD-10-CM | POA: Diagnosis not present

## 2021-11-30 DIAGNOSIS — M6281 Muscle weakness (generalized): Secondary | ICD-10-CM | POA: Diagnosis not present

## 2021-11-30 DIAGNOSIS — M25611 Stiffness of right shoulder, not elsewhere classified: Secondary | ICD-10-CM | POA: Diagnosis not present

## 2021-11-30 DIAGNOSIS — M7521 Bicipital tendinitis, right shoulder: Secondary | ICD-10-CM | POA: Diagnosis not present

## 2021-12-01 DIAGNOSIS — M25611 Stiffness of right shoulder, not elsewhere classified: Secondary | ICD-10-CM | POA: Diagnosis not present

## 2021-12-01 DIAGNOSIS — M75111 Incomplete rotator cuff tear or rupture of right shoulder, not specified as traumatic: Secondary | ICD-10-CM | POA: Diagnosis not present

## 2021-12-01 DIAGNOSIS — M6281 Muscle weakness (generalized): Secondary | ICD-10-CM | POA: Diagnosis not present

## 2021-12-01 DIAGNOSIS — M7521 Bicipital tendinitis, right shoulder: Secondary | ICD-10-CM | POA: Diagnosis not present

## 2021-12-09 DIAGNOSIS — M75111 Incomplete rotator cuff tear or rupture of right shoulder, not specified as traumatic: Secondary | ICD-10-CM | POA: Diagnosis not present

## 2021-12-09 DIAGNOSIS — M25611 Stiffness of right shoulder, not elsewhere classified: Secondary | ICD-10-CM | POA: Diagnosis not present

## 2021-12-09 DIAGNOSIS — M7521 Bicipital tendinitis, right shoulder: Secondary | ICD-10-CM | POA: Diagnosis not present

## 2021-12-09 DIAGNOSIS — M6281 Muscle weakness (generalized): Secondary | ICD-10-CM | POA: Diagnosis not present

## 2021-12-12 DIAGNOSIS — M25611 Stiffness of right shoulder, not elsewhere classified: Secondary | ICD-10-CM | POA: Diagnosis not present

## 2021-12-12 DIAGNOSIS — M7521 Bicipital tendinitis, right shoulder: Secondary | ICD-10-CM | POA: Diagnosis not present

## 2021-12-12 DIAGNOSIS — M75111 Incomplete rotator cuff tear or rupture of right shoulder, not specified as traumatic: Secondary | ICD-10-CM | POA: Diagnosis not present

## 2021-12-12 DIAGNOSIS — M6281 Muscle weakness (generalized): Secondary | ICD-10-CM | POA: Diagnosis not present

## 2021-12-15 DIAGNOSIS — M25611 Stiffness of right shoulder, not elsewhere classified: Secondary | ICD-10-CM | POA: Diagnosis not present

## 2021-12-15 DIAGNOSIS — M6281 Muscle weakness (generalized): Secondary | ICD-10-CM | POA: Diagnosis not present

## 2021-12-15 DIAGNOSIS — M75111 Incomplete rotator cuff tear or rupture of right shoulder, not specified as traumatic: Secondary | ICD-10-CM | POA: Diagnosis not present

## 2021-12-15 DIAGNOSIS — M7521 Bicipital tendinitis, right shoulder: Secondary | ICD-10-CM | POA: Diagnosis not present

## 2021-12-19 DIAGNOSIS — M722 Plantar fascial fibromatosis: Secondary | ICD-10-CM | POA: Diagnosis not present

## 2021-12-19 DIAGNOSIS — M7731 Calcaneal spur, right foot: Secondary | ICD-10-CM | POA: Diagnosis not present

## 2021-12-19 DIAGNOSIS — M79671 Pain in right foot: Secondary | ICD-10-CM | POA: Diagnosis not present

## 2021-12-20 DIAGNOSIS — M75111 Incomplete rotator cuff tear or rupture of right shoulder, not specified as traumatic: Secondary | ICD-10-CM | POA: Diagnosis not present

## 2021-12-22 DIAGNOSIS — M6281 Muscle weakness (generalized): Secondary | ICD-10-CM | POA: Diagnosis not present

## 2021-12-22 DIAGNOSIS — M7521 Bicipital tendinitis, right shoulder: Secondary | ICD-10-CM | POA: Diagnosis not present

## 2021-12-22 DIAGNOSIS — M25611 Stiffness of right shoulder, not elsewhere classified: Secondary | ICD-10-CM | POA: Diagnosis not present

## 2021-12-22 DIAGNOSIS — M75111 Incomplete rotator cuff tear or rupture of right shoulder, not specified as traumatic: Secondary | ICD-10-CM | POA: Diagnosis not present

## 2021-12-26 DIAGNOSIS — M6281 Muscle weakness (generalized): Secondary | ICD-10-CM | POA: Diagnosis not present

## 2021-12-26 DIAGNOSIS — Z4789 Encounter for other orthopedic aftercare: Secondary | ICD-10-CM | POA: Diagnosis not present

## 2021-12-26 DIAGNOSIS — M25511 Pain in right shoulder: Secondary | ICD-10-CM | POA: Diagnosis not present

## 2021-12-29 DIAGNOSIS — Z4789 Encounter for other orthopedic aftercare: Secondary | ICD-10-CM | POA: Diagnosis not present

## 2021-12-29 DIAGNOSIS — M25511 Pain in right shoulder: Secondary | ICD-10-CM | POA: Diagnosis not present

## 2021-12-29 DIAGNOSIS — M6281 Muscle weakness (generalized): Secondary | ICD-10-CM | POA: Diagnosis not present

## 2022-01-05 DIAGNOSIS — M6281 Muscle weakness (generalized): Secondary | ICD-10-CM | POA: Diagnosis not present

## 2022-01-05 DIAGNOSIS — M25511 Pain in right shoulder: Secondary | ICD-10-CM | POA: Diagnosis not present

## 2022-01-05 DIAGNOSIS — Z4789 Encounter for other orthopedic aftercare: Secondary | ICD-10-CM | POA: Diagnosis not present

## 2022-01-09 DIAGNOSIS — M25511 Pain in right shoulder: Secondary | ICD-10-CM | POA: Diagnosis not present

## 2022-01-09 DIAGNOSIS — M6281 Muscle weakness (generalized): Secondary | ICD-10-CM | POA: Diagnosis not present

## 2022-01-09 DIAGNOSIS — Z4789 Encounter for other orthopedic aftercare: Secondary | ICD-10-CM | POA: Diagnosis not present

## 2022-01-12 DIAGNOSIS — M25511 Pain in right shoulder: Secondary | ICD-10-CM | POA: Diagnosis not present

## 2022-01-12 DIAGNOSIS — Z4789 Encounter for other orthopedic aftercare: Secondary | ICD-10-CM | POA: Diagnosis not present

## 2022-01-12 DIAGNOSIS — M6281 Muscle weakness (generalized): Secondary | ICD-10-CM | POA: Diagnosis not present

## 2022-01-16 DIAGNOSIS — Z4789 Encounter for other orthopedic aftercare: Secondary | ICD-10-CM | POA: Diagnosis not present

## 2022-01-16 DIAGNOSIS — M6281 Muscle weakness (generalized): Secondary | ICD-10-CM | POA: Diagnosis not present

## 2022-01-16 DIAGNOSIS — M25511 Pain in right shoulder: Secondary | ICD-10-CM | POA: Diagnosis not present

## 2022-01-18 IMAGING — DX DG ABDOMEN 1V
1 series · 1 of 1 positions shown · non-contrast
Comparison: Lumbar spine CT dated 02/22/2010 abdominal radiograph
dated 05/10/2009.

CLINICAL DATA: 59-year-old male with kidney stone.

EXAM:
ABDOMEN - 1 VIEW

[abdomen kub]
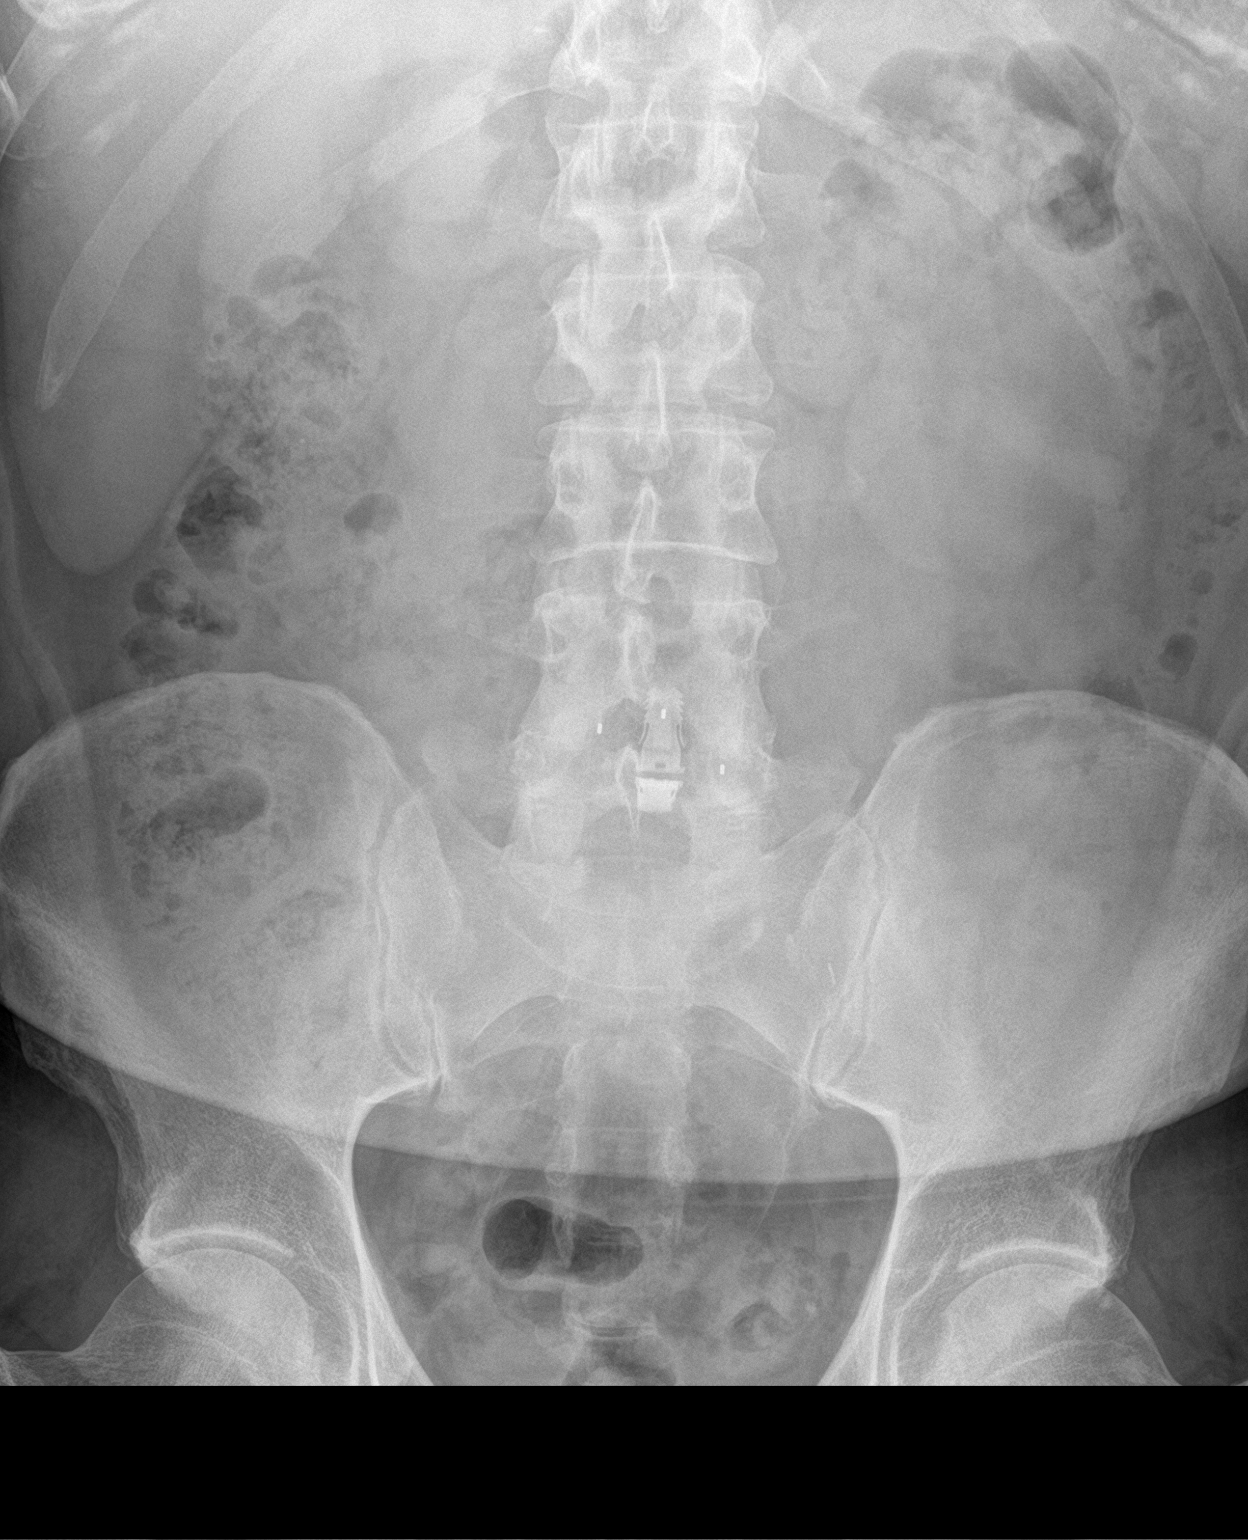

[1 of 1 positions shown; findings below may reference images not displayed]

FINDINGS: There is no bowel dilatation or evidence of obstruction. No free air
or radiopaque calculi identified. Tiny radiopaque focus over the
left hemipelvis, likely stool content. L4-L5 fusion. The soft
tissues are unremarkable.
IMPRESSION: No definite radiopaque calculi.

## 2022-01-19 DIAGNOSIS — M6281 Muscle weakness (generalized): Secondary | ICD-10-CM | POA: Diagnosis not present

## 2022-01-19 DIAGNOSIS — Z4789 Encounter for other orthopedic aftercare: Secondary | ICD-10-CM | POA: Diagnosis not present

## 2022-01-19 DIAGNOSIS — M25511 Pain in right shoulder: Secondary | ICD-10-CM | POA: Diagnosis not present

## 2022-01-20 DIAGNOSIS — M75111 Incomplete rotator cuff tear or rupture of right shoulder, not specified as traumatic: Secondary | ICD-10-CM | POA: Diagnosis not present

## 2022-01-23 DIAGNOSIS — M961 Postlaminectomy syndrome, not elsewhere classified: Secondary | ICD-10-CM | POA: Diagnosis not present

## 2022-01-24 DIAGNOSIS — Z1321 Encounter for screening for nutritional disorder: Secondary | ICD-10-CM | POA: Diagnosis not present

## 2022-01-24 DIAGNOSIS — K219 Gastro-esophageal reflux disease without esophagitis: Secondary | ICD-10-CM | POA: Diagnosis not present

## 2022-01-24 DIAGNOSIS — E559 Vitamin D deficiency, unspecified: Secondary | ICD-10-CM | POA: Diagnosis not present

## 2022-01-24 DIAGNOSIS — R7303 Prediabetes: Secondary | ICD-10-CM | POA: Diagnosis not present

## 2022-01-24 DIAGNOSIS — R739 Hyperglycemia, unspecified: Secondary | ICD-10-CM | POA: Diagnosis not present

## 2022-01-24 DIAGNOSIS — Z1329 Encounter for screening for other suspected endocrine disorder: Secondary | ICD-10-CM | POA: Diagnosis not present

## 2022-01-24 DIAGNOSIS — E7849 Other hyperlipidemia: Secondary | ICD-10-CM | POA: Diagnosis not present

## 2022-01-30 DIAGNOSIS — E7849 Other hyperlipidemia: Secondary | ICD-10-CM | POA: Diagnosis not present

## 2022-01-30 DIAGNOSIS — R030 Elevated blood-pressure reading, without diagnosis of hypertension: Secondary | ICD-10-CM | POA: Diagnosis not present

## 2022-01-30 DIAGNOSIS — R7303 Prediabetes: Secondary | ICD-10-CM | POA: Diagnosis not present

## 2022-01-30 DIAGNOSIS — M545 Low back pain, unspecified: Secondary | ICD-10-CM | POA: Diagnosis not present

## 2022-01-30 DIAGNOSIS — K219 Gastro-esophageal reflux disease without esophagitis: Secondary | ICD-10-CM | POA: Diagnosis not present

## 2022-01-30 DIAGNOSIS — C4490 Unspecified malignant neoplasm of skin, unspecified: Secondary | ICD-10-CM | POA: Diagnosis not present

## 2022-01-30 DIAGNOSIS — Z0001 Encounter for general adult medical examination with abnormal findings: Secondary | ICD-10-CM | POA: Diagnosis not present

## 2022-01-30 DIAGNOSIS — M79642 Pain in left hand: Secondary | ICD-10-CM | POA: Diagnosis not present

## 2022-02-06 DIAGNOSIS — M25511 Pain in right shoulder: Secondary | ICD-10-CM | POA: Diagnosis not present

## 2022-02-06 DIAGNOSIS — M6281 Muscle weakness (generalized): Secondary | ICD-10-CM | POA: Diagnosis not present

## 2022-02-06 DIAGNOSIS — Z4789 Encounter for other orthopedic aftercare: Secondary | ICD-10-CM | POA: Diagnosis not present

## 2022-02-09 DIAGNOSIS — Z4789 Encounter for other orthopedic aftercare: Secondary | ICD-10-CM | POA: Diagnosis not present

## 2022-02-09 DIAGNOSIS — M25511 Pain in right shoulder: Secondary | ICD-10-CM | POA: Diagnosis not present

## 2022-02-09 DIAGNOSIS — M6281 Muscle weakness (generalized): Secondary | ICD-10-CM | POA: Diagnosis not present

## 2022-02-13 DIAGNOSIS — M25511 Pain in right shoulder: Secondary | ICD-10-CM | POA: Diagnosis not present

## 2022-02-13 DIAGNOSIS — M6281 Muscle weakness (generalized): Secondary | ICD-10-CM | POA: Diagnosis not present

## 2022-02-13 DIAGNOSIS — Z4789 Encounter for other orthopedic aftercare: Secondary | ICD-10-CM | POA: Diagnosis not present

## 2022-02-20 DIAGNOSIS — M6281 Muscle weakness (generalized): Secondary | ICD-10-CM | POA: Diagnosis not present

## 2022-02-20 DIAGNOSIS — M25511 Pain in right shoulder: Secondary | ICD-10-CM | POA: Diagnosis not present

## 2022-02-20 DIAGNOSIS — Z4789 Encounter for other orthopedic aftercare: Secondary | ICD-10-CM | POA: Diagnosis not present

## 2022-02-21 DIAGNOSIS — M75111 Incomplete rotator cuff tear or rupture of right shoulder, not specified as traumatic: Secondary | ICD-10-CM | POA: Diagnosis not present

## 2022-02-27 DIAGNOSIS — M25511 Pain in right shoulder: Secondary | ICD-10-CM | POA: Diagnosis not present

## 2022-02-27 DIAGNOSIS — Z4789 Encounter for other orthopedic aftercare: Secondary | ICD-10-CM | POA: Diagnosis not present

## 2022-02-27 DIAGNOSIS — M6281 Muscle weakness (generalized): Secondary | ICD-10-CM | POA: Diagnosis not present

## 2022-03-06 DIAGNOSIS — M25511 Pain in right shoulder: Secondary | ICD-10-CM | POA: Diagnosis not present

## 2022-03-06 DIAGNOSIS — Z4789 Encounter for other orthopedic aftercare: Secondary | ICD-10-CM | POA: Diagnosis not present

## 2022-03-06 DIAGNOSIS — M6281 Muscle weakness (generalized): Secondary | ICD-10-CM | POA: Diagnosis not present

## 2022-04-17 DIAGNOSIS — M961 Postlaminectomy syndrome, not elsewhere classified: Secondary | ICD-10-CM | POA: Diagnosis not present

## 2022-05-09 DIAGNOSIS — R7303 Prediabetes: Secondary | ICD-10-CM | POA: Diagnosis not present

## 2022-05-09 DIAGNOSIS — E7849 Other hyperlipidemia: Secondary | ICD-10-CM | POA: Diagnosis not present

## 2022-05-09 DIAGNOSIS — K219 Gastro-esophageal reflux disease without esophagitis: Secondary | ICD-10-CM | POA: Diagnosis not present

## 2022-05-09 DIAGNOSIS — Z1321 Encounter for screening for nutritional disorder: Secondary | ICD-10-CM | POA: Diagnosis not present

## 2022-05-29 DIAGNOSIS — Z1283 Encounter for screening for malignant neoplasm of skin: Secondary | ICD-10-CM | POA: Diagnosis not present

## 2022-05-29 DIAGNOSIS — D225 Melanocytic nevi of trunk: Secondary | ICD-10-CM | POA: Diagnosis not present

## 2022-05-29 DIAGNOSIS — X32XXXD Exposure to sunlight, subsequent encounter: Secondary | ICD-10-CM | POA: Diagnosis not present

## 2022-05-29 DIAGNOSIS — D485 Neoplasm of uncertain behavior of skin: Secondary | ICD-10-CM | POA: Diagnosis not present

## 2022-05-29 DIAGNOSIS — L570 Actinic keratosis: Secondary | ICD-10-CM | POA: Diagnosis not present

## 2022-06-06 DIAGNOSIS — M65332 Trigger finger, left middle finger: Secondary | ICD-10-CM | POA: Diagnosis not present

## 2022-06-26 DIAGNOSIS — M961 Postlaminectomy syndrome, not elsewhere classified: Secondary | ICD-10-CM | POA: Diagnosis not present

## 2022-07-03 DIAGNOSIS — L988 Other specified disorders of the skin and subcutaneous tissue: Secondary | ICD-10-CM | POA: Diagnosis not present

## 2022-07-03 DIAGNOSIS — D485 Neoplasm of uncertain behavior of skin: Secondary | ICD-10-CM | POA: Diagnosis not present

## 2022-08-24 ENCOUNTER — Other Ambulatory Visit: Payer: Self-pay | Admitting: Orthopedic Surgery

## 2022-08-24 DIAGNOSIS — M6530 Trigger finger, unspecified finger: Secondary | ICD-10-CM | POA: Diagnosis not present

## 2022-08-29 ENCOUNTER — Other Ambulatory Visit: Payer: Self-pay

## 2022-08-29 ENCOUNTER — Encounter (HOSPITAL_BASED_OUTPATIENT_CLINIC_OR_DEPARTMENT_OTHER): Payer: Self-pay | Admitting: Orthopedic Surgery

## 2022-09-01 NOTE — Progress Notes (Signed)

## 2022-09-07 ENCOUNTER — Ambulatory Visit (HOSPITAL_BASED_OUTPATIENT_CLINIC_OR_DEPARTMENT_OTHER)
Admission: RE | Admit: 2022-09-07 | Discharge: 2022-09-07 | Disposition: A | Discharge status: Home / Self Care | Payer: Medicare Other | Attending: Orthopedic Surgery | Admitting: Orthopedic Surgery

## 2022-09-07 ENCOUNTER — Encounter (HOSPITAL_BASED_OUTPATIENT_CLINIC_OR_DEPARTMENT_OTHER): Payer: Self-pay | Admitting: Orthopedic Surgery

## 2022-09-07 ENCOUNTER — Ambulatory Visit (HOSPITAL_BASED_OUTPATIENT_CLINIC_OR_DEPARTMENT_OTHER): Payer: Medicare Other | Admitting: Anesthesiology

## 2022-09-07 ENCOUNTER — Other Ambulatory Visit: Payer: Self-pay

## 2022-09-07 ENCOUNTER — Encounter (HOSPITAL_BASED_OUTPATIENT_CLINIC_OR_DEPARTMENT_OTHER)
Admission: RE | Disposition: A | Discharge status: Home / Self Care | Payer: Self-pay | Source: Home / Self Care | Attending: Orthopedic Surgery

## 2022-09-07 DIAGNOSIS — K219 Gastro-esophageal reflux disease without esophagitis: Secondary | ICD-10-CM | POA: Diagnosis not present

## 2022-09-07 DIAGNOSIS — M65342 Trigger finger, left ring finger: Secondary | ICD-10-CM | POA: Diagnosis not present

## 2022-09-07 DIAGNOSIS — E669 Obesity, unspecified: Secondary | ICD-10-CM | POA: Diagnosis not present

## 2022-09-07 DIAGNOSIS — Z87891 Personal history of nicotine dependence: Secondary | ICD-10-CM | POA: Diagnosis not present

## 2022-09-07 DIAGNOSIS — Z79899 Other long term (current) drug therapy: Secondary | ICD-10-CM | POA: Insufficient documentation

## 2022-09-07 DIAGNOSIS — M65332 Trigger finger, left middle finger: Secondary | ICD-10-CM | POA: Diagnosis not present

## 2022-09-07 DIAGNOSIS — Z01818 Encounter for other preprocedural examination: Secondary | ICD-10-CM

## 2022-09-07 DIAGNOSIS — Z6831 Body mass index (BMI) 31.0-31.9, adult: Secondary | ICD-10-CM

## 2022-09-07 DIAGNOSIS — M65842 Other synovitis and tenosynovitis, left hand: Secondary | ICD-10-CM | POA: Diagnosis not present

## 2022-09-07 DIAGNOSIS — Z09 Encounter for follow-up examination after completed treatment for conditions other than malignant neoplasm: Secondary | ICD-10-CM | POA: Insufficient documentation

## 2022-09-07 HISTORY — PX: TRIGGER FINGER RELEASE: SHX641

## 2022-09-07 SURGERY — RELEASE, A1 PULLEY, FOR TRIGGER FINGER
Anesthesia: General | Laterality: Left

## 2022-09-07 MED ORDER — MIDAZOLAM HCL 5 MG/5ML IJ SOLN
INTRAMUSCULAR | Status: DC | PRN
  Administered 2022-09-07: 2 mg via INTRAVENOUS

## 2022-09-07 MED ORDER — BUPIVACAINE HCL (PF) 0.25 % IJ SOLN
INTRAMUSCULAR | Status: AC
  Filled 2022-09-07: qty 150

## 2022-09-07 MED ORDER — ONDANSETRON HCL 4 MG/2ML IJ SOLN
INTRAMUSCULAR | Status: AC
  Filled 2022-09-07: qty 2

## 2022-09-07 MED ORDER — MEPERIDINE HCL 25 MG/ML IJ SOLN: 6.2500 mg | INTRAMUSCULAR | Status: DC | PRN

## 2022-09-07 MED ORDER — ATROPINE SULFATE 0.4 MG/ML IV SOLN
INTRAVENOUS | Status: AC
  Filled 2022-09-07: qty 1

## 2022-09-07 MED ORDER — CEFAZOLIN SODIUM-DEXTROSE 2-4 GM/100ML-% IV SOLN
INTRAVENOUS | Status: AC
  Filled 2022-09-07: qty 100

## 2022-09-07 MED ORDER — DEXAMETHASONE SODIUM PHOSPHATE 10 MG/ML IJ SOLN
INTRAMUSCULAR | Status: AC
  Filled 2022-09-07: qty 1

## 2022-09-07 MED ORDER — ONDANSETRON HCL 4 MG/2ML IJ SOLN
INTRAMUSCULAR | Status: DC | PRN
  Administered 2022-09-07: 4 mg via INTRAVENOUS

## 2022-09-07 MED ORDER — ACETAMINOPHEN 160 MG/5ML PO SOLN: 325.0000 mg | ORAL | Status: DC | PRN

## 2022-09-07 MED ORDER — FENTANYL CITRATE (PF) 100 MCG/2ML IJ SOLN
INTRAMUSCULAR | Status: DC | PRN
  Administered 2022-09-07: 100 ug via INTRAVENOUS

## 2022-09-07 MED ORDER — OXYCODONE HCL 5 MG PO TABS: 5.0000 mg | ORAL_TABLET | Freq: Once | ORAL | Status: DC | PRN

## 2022-09-07 MED ORDER — MIDAZOLAM HCL 2 MG/2ML IJ SOLN
INTRAMUSCULAR | Status: AC
  Filled 2022-09-07: qty 2

## 2022-09-07 MED ORDER — OXYCODONE HCL 5 MG/5ML PO SOLN: 5.0000 mg | Freq: Once | ORAL | Status: DC | PRN

## 2022-09-07 MED ORDER — DEXAMETHASONE SODIUM PHOSPHATE 10 MG/ML IJ SOLN
INTRAMUSCULAR | Status: DC | PRN
  Administered 2022-09-07: 5 mg via INTRAVENOUS

## 2022-09-07 MED ORDER — ONDANSETRON HCL 4 MG/2ML IJ SOLN: 4.0000 mg | Freq: Once | INTRAMUSCULAR | Status: DC | PRN

## 2022-09-07 MED ORDER — FENTANYL CITRATE (PF) 100 MCG/2ML IJ SOLN: 25.0000 ug | INTRAMUSCULAR | Status: DC | PRN

## 2022-09-07 MED ORDER — LIDOCAINE HCL (CARDIAC) PF 100 MG/5ML IV SOSY
PREFILLED_SYRINGE | INTRAVENOUS | Status: DC | PRN
  Administered 2022-09-07: 100 mg via INTRAVENOUS

## 2022-09-07 MED ORDER — LIDOCAINE 2% (20 MG/ML) 5 ML SYRINGE
INTRAMUSCULAR | Status: AC
  Filled 2022-09-07: qty 5

## 2022-09-07 MED ORDER — BUPIVACAINE HCL (PF) 0.25 % IJ SOLN
INTRAMUSCULAR | Status: DC | PRN
  Administered 2022-09-07: 9 mL

## 2022-09-07 MED ORDER — ACETAMINOPHEN 325 MG PO TABS: 325.0000 mg | ORAL_TABLET | ORAL | Status: DC | PRN

## 2022-09-07 MED ORDER — SUCCINYLCHOLINE CHLORIDE 200 MG/10ML IV SOSY
PREFILLED_SYRINGE | INTRAVENOUS | Status: AC
  Filled 2022-09-07: qty 10

## 2022-09-07 MED ORDER — FENTANYL CITRATE (PF) 100 MCG/2ML IJ SOLN
INTRAMUSCULAR | Status: AC
  Filled 2022-09-07: qty 2

## 2022-09-07 MED ORDER — EPHEDRINE 5 MG/ML INJ
INTRAVENOUS | Status: AC
  Filled 2022-09-07: qty 5

## 2022-09-07 MED ORDER — LACTATED RINGERS IV SOLN: INTRAVENOUS | Status: DC

## 2022-09-07 MED ORDER — ACETAMINOPHEN 10 MG/ML IV SOLN: 1000.0000 mg | Freq: Once | INTRAVENOUS | Status: DC | PRN

## 2022-09-07 MED ORDER — PROPOFOL 10 MG/ML IV BOLUS
INTRAVENOUS | Status: DC | PRN
  Administered 2022-09-07: 200 mg via INTRAVENOUS

## 2022-09-07 MED ORDER — CEFAZOLIN SODIUM-DEXTROSE 2-4 GM/100ML-% IV SOLN
2.0000 g | INTRAVENOUS | Status: AC
  Administered 2022-09-07: 2 g via INTRAVENOUS

## 2022-09-07 MED ORDER — PHENYLEPHRINE 80 MCG/ML (10ML) SYRINGE FOR IV PUSH (FOR BLOOD PRESSURE SUPPORT)
PREFILLED_SYRINGE | INTRAVENOUS | Status: AC
  Filled 2022-09-07: qty 10

## 2022-09-07 SURGICAL SUPPLY — 31 items
APL PRP STRL LF DISP 70% ISPRP (MISCELLANEOUS) ×1
BLADE SURG 15 STRL LF DISP TIS (BLADE) ×2 IMPLANT
BLADE SURG 15 STRL SS (BLADE) ×2
BNDG CMPR 5X2 CHSV 1 LYR STRL (GAUZE/BANDAGES/DRESSINGS) ×1
BNDG CMPR 9X4 STRL LF SNTH (GAUZE/BANDAGES/DRESSINGS)
BNDG COHESIVE 2X5 TAN ST LF (GAUZE/BANDAGES/DRESSINGS) ×1 IMPLANT
BNDG ESMARK 4X9 LF (GAUZE/BANDAGES/DRESSINGS) IMPLANT
CHLORAPREP W/TINT 26 (MISCELLANEOUS) ×1 IMPLANT
CORD BIPOLAR FORCEPS 12FT (ELECTRODE) ×1 IMPLANT
COVER BACK TABLE 60X90IN (DRAPES) ×1 IMPLANT
COVER MAYO STAND STRL (DRAPES) ×1 IMPLANT
CUFF TOURN SGL QUICK 18X4 (TOURNIQUET CUFF) ×1 IMPLANT
DRAPE EXTREMITY T 121X128X90 (DISPOSABLE) ×1 IMPLANT
DRAPE SURG 17X23 STRL (DRAPES) ×1 IMPLANT
GAUZE SPONGE 4X4 12PLY STRL (GAUZE/BANDAGES/DRESSINGS) ×1 IMPLANT
GAUZE XEROFORM 1X8 LF (GAUZE/BANDAGES/DRESSINGS) ×1 IMPLANT
GLOVE BIO SURGEON STRL SZ7.5 (GLOVE) ×1 IMPLANT
GLOVE BIOGEL PI IND STRL 8 (GLOVE) ×1 IMPLANT
GOWN STRL REUS W/ TWL LRG LVL3 (GOWN DISPOSABLE) ×1 IMPLANT
GOWN STRL REUS W/TWL LRG LVL3 (GOWN DISPOSABLE) ×1
GOWN STRL REUS W/TWL XL LVL3 (GOWN DISPOSABLE) ×1 IMPLANT
NDL HYPO 25X1 1.5 SAFETY (NEEDLE) ×1 IMPLANT
NEEDLE HYPO 25X1 1.5 SAFETY (NEEDLE) ×1 IMPLANT
NS IRRIG 1000ML POUR BTL (IV SOLUTION) ×1 IMPLANT
PACK BASIN DAY SURGERY FS (CUSTOM PROCEDURE TRAY) ×1 IMPLANT
STOCKINETTE 4X48 STRL (DRAPES) ×1 IMPLANT
SUT ETHILON 4 0 PS 2 18 (SUTURE) ×1 IMPLANT
SYR BULB EAR ULCER 3OZ GRN STR (SYRINGE) ×1 IMPLANT
SYR CONTROL 10ML LL (SYRINGE) ×1 IMPLANT
TOWEL GREEN STERILE FF (TOWEL DISPOSABLE) ×2 IMPLANT
UNDERPAD 30X36 HEAVY ABSORB (UNDERPADS AND DIAPERS) ×1 IMPLANT

## 2022-09-07 NOTE — Anesthesia Postprocedure Evaluation (Signed)
Anesthesia Post Note  Patient: Brian Doyle  Procedure(s) Performed: LEFT LONG FINGER A-1 PULLEY RELEASE (Left)     Patient location during evaluation: Phase II Anesthesia Type: General Level of consciousness: awake Pain management: pain level controlled Vital Signs Assessment: post-procedure vital signs reviewed and stable Respiratory status: spontaneous breathing Cardiovascular status: stable Postop Assessment: no apparent nausea or vomiting Anesthetic complications: no  No notable events documented.  Last Vitals:  Vitals:   09/07/22 0925 09/07/22 0937  BP: 117/62 123/71  Pulse: 70 71  Resp: 16 16  Temp:  36.6 C  SpO2:  98%    Last Pain:  Vitals:   09/07/22 0937  TempSrc: Temporal  PainSc: 0-No pain                 Caren Macadam

## 2022-09-07 NOTE — Transfer of Care (Signed)
Immediate Anesthesia Transfer of Care Note  Patient: Brian Doyle  Procedure(s) Performed: LEFT LONG FINGER A-1 PULLEY RELEASE (Left)  Patient Location: PACU  Anesthesia Type:General  Level of Consciousness: sedated  Airway & Oxygen Therapy: Patient Spontanous Breathing and Patient connected to face mask oxygen  Post-op Assessment: Report given to RN and Post -op Vital signs reviewed and stable  Post vital signs: Reviewed and stable  Last Vitals:  Vitals Value Taken Time  BP    Temp    Pulse 64 09/07/22 0913  Resp    SpO2 93 % 09/07/22 0913  Vitals shown include unvalidated device data.  Last Pain:  Vitals:   09/07/22 0703  TempSrc: Oral  PainSc: 3       Patients Stated Pain Goal: 6 (09/07/22 0703)  Complications: No notable events documented.

## 2022-09-07 NOTE — H&P (Addendum)
Brian Doyle is an 62 y.o. male.   Chief Complaint: trigger digit HPI: 62 yo male with triggering left long finger.  It is bothersome to him.  He wishes to proceed with operative trigger release.  Allergies:  Allergies  Allergen Reactions   Ketamine Hcl     Pt prefers not to have ever again   Morphine And Codeine     cramps   Nsaids     cramps    Past Medical History:  Diagnosis Date   Chronic lower back pain    GERD (gastroesophageal reflux disease)    Hiatal hernia    HLD (hyperlipidemia)    Hypertriglyceridemia    Neuropathy    Pre-diabetes     Past Surgical History:  Procedure Laterality Date   BIOPSY  01/14/2019   Procedure: BIOPSY;  Surgeon: West Bali, MD;  [facility];  Service: Endoscopy;;  gastric esophagus     COLONOSCOPY  10/16/2013   [facility] with Dr. Catalina Gravel; moderate sedation; normal colonoscopy.   ELBOW SURGERY Left    ELBOW SURGERY Right    2023   ESOPHAGOGASTRODUODENOSCOPY     ESOPHAGOGASTRODUODENOSCOPY (EGD) WITH PROPOFOL N/A 01/14/2019   Procedure: ESOPHAGOGASTRODUODENOSCOPY (EGD) WITH PROPOFOL;  Surgeon: West Bali, MD;  [facility]: [facility];  Service: Endoscopy;  Laterality: N/A;  2:45pm   lower back surgery     x 2 (March 2010; Feb 2011)   POLYPECTOMY  01/14/2019   Procedure: POLYPECTOMY;  Surgeon: West Bali, MD;  [facility];  Service: Endoscopy;;  gastric   SAVORY DILATION N/A 01/14/2019   Procedure: SAVORY DILATION;  Surgeon: West Bali, MD;  [facility];  Service: Endoscopy;  Laterality: N/A;   SHOULDER SURGERY Left    SHOULDER SURGERY Right    2023   vasectomy      Family History: Family History  Problem Relation Age of Onset   Lung cancer Mother    Colon cancer Neg Hx    Colon polyps Neg Hx     Social History:   reports that he has quit smoking. His smoking use included cigarettes. He has never used smokeless tobacco. He reports current alcohol  use. He reports that he does not use drugs.  Medications: Medications Prior to Admission  Medication Sig Dispense Refill   gabapentin (NEURONTIN) 300 MG capsule Take 900 mg by mouth 3 (three) times daily.     HYDROcodone-acetaminophen (NORCO/VICODIN) 5-325 MG tablet Take 1 tablet by mouth 3 (three) times daily.     Multiple Vitamin (MULTIVITAMIN) capsule Take 1 capsule by mouth daily.     omeprazole (PRILOSEC) 20 MG capsule Take 20 mg by mouth 2 (two) times daily before a meal.     rosuvastatin (CRESTOR) 5 MG tablet Take 5 mg by mouth 3 (three) times a week.      baclofen (LIORESAL) 10 MG tablet 1 PO 30 MINS PRIOR TO first meal and at bedtime 60 each 3   tadalafil (CIALIS) 20 MG tablet Take 1 tablet (20 mg total) by mouth daily as needed for erectile dysfunction. 30 tablet 5   tamsulosin (FLOMAX) 0.4 MG CAPS capsule Take 1 capsule (0.4 mg total) by mouth daily. 30 capsule 11    No results found for this or any previous visit (from the past 48 hour(s)).  No results found.    Blood pressure (!) 124/96, pulse (!) 55, temperature (!) 97.2 F (36.2 C), temperature source Oral, resp.  rate 18, height 6' (1.829 m), weight 104.6 kg, SpO2 100 %.  General appearance: alert, cooperative, and appears stated age Head: Normocephalic, without obvious abnormality, atraumatic Neck: supple, symmetrical, trachea midline Extremities: Intact sensation and capillary refill all digits.  +epl/fpl/io.  No wounds.  Pulses: 2+ and symmetric Skin: Skin color, texture, turgor normal. No rashes or lesions Neurologic: Grossly normal Incision/Wound: none  Assessment/Plan Left long finger trigger digit.  Non operative and operative treatment options have been discussed with the patient and patient wishes to proceed with operative treatment. Risks, benefits, and alternatives of surgery have been discussed and the patient agrees with the plan of care.   Betha Loa 09/07/2022, 8:24 AM

## 2022-09-07 NOTE — Op Note (Signed)
09/07/2022 [facility]  Operative Note  PREOPERATIVE DIAGNOSIS: LEFT LONG FINGER STENOSING TENOSYNOVITIS  POSTOPERATIVE DIAGNOSIS:  LEFT LONG FINGER STENOSING TENOSYNOVITIS  PROCEDURE: Procedure(s): LEFT LONG FINGER A-1 PULLEY RELEASE   SURGEON:  Betha Loa, MD  ASSISTANT:  none.  ANESTHESIA:  General.  IV FLUIDS:  Per anesthesia flow sheet.  ESTIMATED BLOOD LOSS:  Minimal.  COMPLICATIONS:  None.  SPECIMENS:  None.  TOURNIQUET TIME:  Total Tourniquet Time Documented: Upper Arm (Left) - 9 minutes Total: Upper Arm (Left) - 9 minutes   DISPOSITION:  Stable to PACU.  LOCATION: [facility]  INDICATIONS: Brian Doyle is a 62 y.o. male with triggering left long finger.  It is bothersome to him.  He wishes to have surgical trigger release.  Risks, benefits and alternatives of surgery were discussed including the risk of blood loss, infection, damage to nerves, vessels, tendons, ligaments, bone, failure of surgery, need for additional surgery, complications with wound healing, continued pain, continued triggering and need for repeat surgery.  He voiced understanding of these risks and elected to proceed.  OPERATIVE COURSE:  After being identified preoperatively by myself, the patient and I agreed upon the procedure and site of procedure.  The surgical site was marked. Surgical consent had been signed. He was given IV Ancef as preoperative antibiotic prophylaxis. He was transported to the operating room and placed on the operating room table in supine position with the Left upper extremity on an arm board. General anesthesia was induced by the anesthesiologist.  The Left upper extremity was prepped and draped in normal sterile orthopedic fashion. A surgical pause was performed between surgeons, anesthesia, and operating room staff, and all were in agreement as to the patient, procedure, and site of procedure.  Tourniquet at the proximal aspect of the  extremity was inflated to 250 mmHg after exsanguination of the arm with an Esmarch bandage.  An incision was made at the volar aspect of the MP joint of the long finger.  This was carried into the subcutaneous tissues by spreading technique.  Bipolar electrocautery was used to obtain hemostasis.  The radial and ulnar digital nerves were protected throughout the case. The flexor sheath was identified.  The A1 pulley was identified and sharply incised.  It was released in its entirety.  The proximal 1-2 mm of the A2 pulley was vented to allow better excursion of the tendons.  The finger was placed through a range of motion and there was noted to be no catching.  The tendons were brought through the wound and any adherences released.  The wound was then copiously irrigated with sterile saline. It was closed with 4-0 nylon in a horizontal mattress fashion.  It was injected with 0.25% plain Marcaine to aid in postoperative analgesia.  It was dressed with sterile Xeroform, 4x4s, and wrapped lightly with a Coban dressing.  Tourniquet was deflated at 9 minutes.  The fingertips were pink with brisk capillary refill after deflation of the tourniquet.  The operative drapes were broken down and the patient was awoken from anesthesia safely.  He was transferred back to the stretcher and taken to the PACU in stable condition.   I will see him back in the office in 1 week for postoperative followup.  He states he has pain medication at home and does not need a prescription.    Betha Loa, MD Electronically signed, 09/07/22

## 2022-09-07 NOTE — Discharge Instructions (Addendum)

## 2022-09-07 NOTE — Anesthesia Preprocedure Evaluation (Signed)
Anesthesia Evaluation  Patient identified by MRN, date of birth, ID band Patient awake    Reviewed: Allergy & Precautions, NPO status , Patient's Chart, lab work & pertinent test results  Airway Mallampati: I       Dental no notable dental hx.    Pulmonary former smoker   Pulmonary exam normal        Cardiovascular negative cardio ROS Normal cardiovascular exam     Neuro/Psych  negative psych ROS   GI/Hepatic Neg liver ROS,GERD  Medicated,,  Endo/Other  negative endocrine ROS    Renal/GU negative Renal ROS  negative genitourinary   Musculoskeletal negative musculoskeletal ROS (+)    Abdominal  (+) + obese  Peds  Hematology negative hematology ROS (+)   Anesthesia Other Findings   Reproductive/Obstetrics                             Anesthesia Physical Anesthesia Plan  ASA: 2  Anesthesia Plan: General   Post-op Pain Management: Minimal or no pain anticipated   Induction: Intravenous  PONV Risk Score and Plan: 2 and Ondansetron, Dexamethasone and Midazolam  Airway Management Planned: LMA  Additional Equipment: None  Intra-op Plan:   Post-operative Plan: Extubation in OR  Informed Consent: I have reviewed the patients History and Physical, chart, labs and discussed the procedure including the risks, benefits and alternatives for the proposed anesthesia with the patient or authorized representative who has indicated his/her understanding and acceptance.     Dental advisory given  Plan Discussed with: CRNA  Anesthesia Plan Comments:        Anesthesia Quick Evaluation

## 2022-09-07 NOTE — Anesthesia Procedure Notes (Signed)
Procedure Name: LMA Insertion Date/Time: 09/07/2022 8:45 AM  Performed by: Ronnette Hila, CRNAPre-anesthesia Checklist: Patient identified, Emergency Drugs available, Suction available and Patient being monitored Patient Re-evaluated:Patient Re-evaluated prior to induction Oxygen Delivery Method: Circle system utilized Preoxygenation: Pre-oxygenation with 100% oxygen Induction Type: IV induction Ventilation: Mask ventilation without difficulty LMA: LMA inserted LMA Size: 5.0 Number of attempts: 1 Airway Equipment and Method: Bite block Placement Confirmation: positive ETCO2 Tube secured with: Tape Dental Injury: Teeth and Oropharynx as per pre-operative assessment

## 2022-09-08 ENCOUNTER — Encounter (HOSPITAL_BASED_OUTPATIENT_CLINIC_OR_DEPARTMENT_OTHER): Payer: Self-pay | Admitting: Orthopedic Surgery

## 2022-09-15 DIAGNOSIS — M6530 Trigger finger, unspecified finger: Secondary | ICD-10-CM | POA: Diagnosis not present

## 2022-09-21 DIAGNOSIS — M65332 Trigger finger, left middle finger: Secondary | ICD-10-CM | POA: Diagnosis not present

## 2022-09-22 DIAGNOSIS — H905 Unspecified sensorineural hearing loss: Secondary | ICD-10-CM | POA: Diagnosis not present

## 2022-09-25 DIAGNOSIS — M961 Postlaminectomy syndrome, not elsewhere classified: Secondary | ICD-10-CM | POA: Diagnosis not present

## 2022-10-11 DIAGNOSIS — M6530 Trigger finger, unspecified finger: Secondary | ICD-10-CM | POA: Diagnosis not present

## 2022-12-26 DIAGNOSIS — M961 Postlaminectomy syndrome, not elsewhere classified: Secondary | ICD-10-CM | POA: Diagnosis not present

## 2023-01-29 DIAGNOSIS — K219 Gastro-esophageal reflux disease without esophagitis: Secondary | ICD-10-CM | POA: Diagnosis not present

## 2023-01-29 DIAGNOSIS — R739 Hyperglycemia, unspecified: Secondary | ICD-10-CM | POA: Diagnosis not present

## 2023-01-29 DIAGNOSIS — R7303 Prediabetes: Secondary | ICD-10-CM | POA: Diagnosis not present

## 2023-01-29 DIAGNOSIS — E039 Hypothyroidism, unspecified: Secondary | ICD-10-CM | POA: Diagnosis not present

## 2023-01-29 DIAGNOSIS — E559 Vitamin D deficiency, unspecified: Secondary | ICD-10-CM | POA: Diagnosis not present

## 2023-01-29 DIAGNOSIS — E7849 Other hyperlipidemia: Secondary | ICD-10-CM | POA: Diagnosis not present

## 2023-02-05 DIAGNOSIS — M79642 Pain in left hand: Secondary | ICD-10-CM | POA: Diagnosis not present

## 2023-02-05 DIAGNOSIS — D649 Anemia, unspecified: Secondary | ICD-10-CM | POA: Diagnosis not present

## 2023-02-05 DIAGNOSIS — R7303 Prediabetes: Secondary | ICD-10-CM | POA: Diagnosis not present

## 2023-02-05 DIAGNOSIS — R1310 Dysphagia, unspecified: Secondary | ICD-10-CM | POA: Diagnosis not present

## 2023-02-05 DIAGNOSIS — R030 Elevated blood-pressure reading, without diagnosis of hypertension: Secondary | ICD-10-CM | POA: Diagnosis not present

## 2023-02-05 DIAGNOSIS — Z0001 Encounter for general adult medical examination with abnormal findings: Secondary | ICD-10-CM | POA: Diagnosis not present

## 2023-02-05 DIAGNOSIS — C4490 Unspecified malignant neoplasm of skin, unspecified: Secondary | ICD-10-CM | POA: Diagnosis not present

## 2023-02-05 DIAGNOSIS — Z1389 Encounter for screening for other disorder: Secondary | ICD-10-CM | POA: Diagnosis not present

## 2023-02-05 DIAGNOSIS — K219 Gastro-esophageal reflux disease without esophagitis: Secondary | ICD-10-CM | POA: Diagnosis not present

## 2023-02-05 DIAGNOSIS — E7849 Other hyperlipidemia: Secondary | ICD-10-CM | POA: Diagnosis not present

## 2023-02-05 DIAGNOSIS — E782 Mixed hyperlipidemia: Secondary | ICD-10-CM | POA: Diagnosis not present

## 2023-02-05 DIAGNOSIS — M545 Low back pain, unspecified: Secondary | ICD-10-CM | POA: Diagnosis not present

## 2023-02-08 ENCOUNTER — Encounter: Payer: Self-pay | Admitting: *Deleted

## 2023-02-08 ENCOUNTER — Encounter: Payer: Self-pay | Admitting: Internal Medicine

## 2023-02-08 ENCOUNTER — Other Ambulatory Visit: Payer: Self-pay | Admitting: *Deleted

## 2023-02-08 ENCOUNTER — Ambulatory Visit: Payer: Medicare Other | Admitting: Internal Medicine

## 2023-02-08 VITALS — BP 130/77 | HR 63 | Temp 98.6°F | Ht 72.0 in | Wt 246.2 lb

## 2023-02-08 DIAGNOSIS — K219 Gastro-esophageal reflux disease without esophagitis: Secondary | ICD-10-CM | POA: Diagnosis not present

## 2023-02-08 DIAGNOSIS — R1319 Other dysphagia: Secondary | ICD-10-CM

## 2023-02-08 DIAGNOSIS — D509 Iron deficiency anemia, unspecified: Secondary | ICD-10-CM | POA: Diagnosis not present

## 2023-02-08 DIAGNOSIS — K222 Esophageal obstruction: Secondary | ICD-10-CM | POA: Diagnosis not present

## 2023-02-08 DIAGNOSIS — R1310 Dysphagia, unspecified: Secondary | ICD-10-CM | POA: Diagnosis not present

## 2023-02-08 MED ORDER — PEG 3350-KCL-NA BICARB-NACL 420 G PO SOLR: 4000.0000 mL | Freq: Once | ORAL | 0 refills | Status: AC

## 2023-02-08 NOTE — Progress Notes (Signed)
Primary Care Physician:  Richardean Chimera, MD Primary Gastroenterologist:  Dr. Marletta Lor  Chief Complaint  Patient presents with   New Patient (Initial Visit)    Pt referred for anemia and troubling swallowing (had throat stretched years ago)    HPI:   Brian Doyle is a 62 y.o. male who presents to clinic today by referral from his PCP Dr. Mayford Knife for evaluation.  Multiple GI issues:  Chronic GERD: Has been on omeprazole since 2002.  Relatively well-controlled on omeprazole 20 mg twice daily.  Occasional breakthrough symptoms.  Esophageal dysphagia/Schatzki's ring: Progressively worsening.  Feels like food gets stuck in the substernal region.  Notes breads especially are difficult to pass.  He chews food up very well.  Drinks plenty water with meals.  EGD 01/14/2019 with moderate Schatzki's ring distal esophagus dilated up to 17 mm.  Multiple polyps in the gastric fundus and body.  Pathology with fundic gland polyps, gastric biopsies and active gastritis with goblet cell metaplasia, esophageal biopsies negative for EOE.  Iron deficiency anemia: [place] on routine blood work.  Iron 77, ferritin 12, hemoglobin 12.5, MCV 76.  Last colonoscopy 2015 unremarkable.  Patient denies any melena hematochezia.  No abdominal pain.  No unintentional weight loss.  No family history of colorectal malignancy.  Patient is a retired Counsellor.  Past Medical History:  Diagnosis Date   Chronic lower back pain    GERD (gastroesophageal reflux disease)    Hiatal hernia    HLD (hyperlipidemia)    Hypertriglyceridemia    Neuropathy    Pre-diabetes     Past Surgical History:  Procedure Laterality Date   BIOPSY  01/14/2019   Procedure: BIOPSY;  Surgeon: West Bali, MD;  [facility];  Service: Endoscopy;;  gastric esophagus     COLONOSCOPY  10/16/2013   [facility] with Dr. Catalina Gravel; moderate sedation; normal colonoscopy.   ELBOW SURGERY  Left    ELBOW SURGERY Right    2023   ESOPHAGOGASTRODUODENOSCOPY     ESOPHAGOGASTRODUODENOSCOPY (EGD) WITH PROPOFOL N/A 01/14/2019   Procedure: ESOPHAGOGASTRODUODENOSCOPY (EGD) WITH PROPOFOL;  Surgeon: West Bali, MD;  [facility];  Service: Endoscopy;  Laterality: N/A;  2:45pm   lower back surgery     x 2 (March 2010; Feb 2011)   POLYPECTOMY  01/14/2019   Procedure: POLYPECTOMY;  Surgeon: West Bali, MD;  [facility];  Service: Endoscopy;;  gastric   SAVORY DILATION N/A 01/14/2019   Procedure: SAVORY DILATION;  Surgeon: West Bali, MD;  [facility];  Service: Endoscopy;  Laterality: N/A;   SHOULDER SURGERY Left    SHOULDER SURGERY Right    2023   TRIGGER FINGER RELEASE Left 09/07/2022   Procedure: LEFT LONG FINGER A-1 PULLEY RELEASE;  Surgeon: Betha Loa, MD;  Location: [facility];  Service: Orthopedics;  Laterality: Left;  45 MIN   vasectomy      Current Outpatient Medications  Medication Sig Dispense Refill   gabapentin (NEURONTIN) 300 MG capsule Take 900 mg by mouth 3 (three) times daily.     HYDROcodone-acetaminophen (NORCO/VICODIN) 5-325 MG tablet Take 1 tablet by mouth 3 (three) times daily.     Multiple Vitamin (MULTIVITAMIN) capsule Take 1 capsule by mouth daily.     omeprazole (PRILOSEC) 20 MG capsule Take 20 mg by mouth 2 (two) times daily before a meal.     rosuvastatin (CRESTOR) 5 MG tablet Take  10 mg by mouth daily.     tadalafil (CIALIS) 20 MG tablet Take 1 tablet (20 mg total) by mouth daily as needed for erectile dysfunction. 30 tablet 5   tamsulosin (FLOMAX) 0.4 MG CAPS capsule Take 1 capsule (0.4 mg total) by mouth daily. (Patient not taking: Reported on 02/08/2023) 30 capsule 11   No current facility-administered medications for this visit.    Allergies as of 02/08/2023 - Review Complete 02/08/2023  Allergen Reaction Noted   Ketamine hcl  01/07/2019   Morphine and codeine  12/09/2018   Nsaids   12/09/2018    Family History  Problem Relation Age of Onset   Lung cancer Mother    Colon cancer Neg Hx    Colon polyps Neg Hx     Social History   Socioeconomic History   Marital status: Married    Spouse name: Not on file   Number of children: Not on file   Years of education: Not on file   Highest education level: Not on file  Occupational History   Occupation: retired  Tobacco Use   Smoking status: Former    Types: Cigarettes   Smokeless tobacco: Never  Vaping Use   Vaping status: Never Used  Substance and Sexual Activity   Alcohol use: Yes    Comment: occas   Drug use: Never   Sexual activity: Yes  Other Topics Concern   Not on file  Social History Narrative   Not on file   Social Determinants of Health   Financial Resource Strain: Not on file  Food Insecurity: Not on file  Transportation Needs: Not on file  Physical Activity: Not on file  Stress: Not on file  Social Connections: Not on file  Intimate Partner Violence: Not on file    Subjective: Review of Systems  Constitutional:  Negative for chills and fever.  HENT:  Negative for congestion and hearing loss.   Eyes:  Negative for blurred vision and double vision.  Respiratory:  Negative for cough and shortness of breath.   Cardiovascular:  Negative for chest pain and palpitations.  Gastrointestinal:  Positive for heartburn. Negative for abdominal pain, blood in stool, constipation, diarrhea, melena and vomiting.       Dysphagia  Genitourinary:  Negative for dysuria and urgency.  Musculoskeletal:  Negative for joint pain and myalgias.  Skin:  Negative for itching and rash.  Neurological:  Negative for dizziness and headaches.  Psychiatric/Behavioral:  Negative for depression. The patient is not nervous/anxious.        Objective: BP 130/77   Pulse 63   Temp 98.6 F (37 C)   Ht 6' (1.829 m)   Wt 246 lb 3.2 oz (111.7 kg)   BMI 33.39 kg/m  Physical Exam Constitutional:      Appearance:  Normal appearance.  HENT:     Head: Normocephalic and atraumatic.  Eyes:     Extraocular Movements: Extraocular movements intact.     Conjunctiva/sclera: Conjunctivae normal.  Cardiovascular:     Rate and Rhythm: Normal rate and regular rhythm.  Pulmonary:     Effort: Pulmonary effort is normal.     Breath sounds: Normal breath sounds.  Abdominal:     General: Bowel sounds are normal.     Palpations: Abdomen is soft.  Musculoskeletal:        General: Normal range of motion.     Cervical back: Normal range of motion and neck supple.  Skin:    General: Skin is warm.  Neurological:     General: No focal deficit present.     Mental Status: He is alert and oriented to person, place, and time.  Psychiatric:        Mood and Affect: Mood normal.        Behavior: Behavior normal.      Assessment: *Chronic GERD *Esophageal dysphagia  *Schatzki ring *Iron deficiency anemia   Plan: Will schedule for EGD with possible dilation to evaluate for peptic ulcer disease, esophagitis, gastritis, H. Pylori, duodenitis, or other. Will also evaluate for esophageal stricture, Schatzki's ring, esophageal web or other.   At the same time will proceed with colonoscopy due to newfound iron deficiency anemia.   The risks including infection, bleed, or perforation as well as benefits, limitations, alternatives and imponderables have been reviewed with the patient. Potential for esophageal dilation, biopsy, etc. have also been reviewed.  Questions have been answered. All parties agreeable.  Continue on Omeprazole 20 mg BID, may make adjustments pending endoscopic findings.   Continue on oral iron.   Thank you Dr Mayford Knife for the kind referral.   02/08/2023 8:35 AM   Disclaimer: This note was dictated with voice recognition software. Similar sounding words can inadvertently be transcribed and may not be corrected upon review.

## 2023-02-08 NOTE — Patient Instructions (Signed)
We will schedule you for upper endoscopy to further evaluate your chronic reflux as well as feeling of food getting stuck in your esophagus.  I may elect to stretch your esophagus depending on findings.  Continue omeprazole twice daily.  At the same time as upper endoscopy, we will perform colonoscopy given your newfound iron deficiency anemia.  Continue on oral iron.  It was very nice meeting you today.  Dr. Marletta Lor

## 2023-03-13 DIAGNOSIS — D649 Anemia, unspecified: Secondary | ICD-10-CM | POA: Diagnosis not present

## 2023-03-15 ENCOUNTER — Encounter (HOSPITAL_COMMUNITY): Payer: Self-pay

## 2023-03-15 ENCOUNTER — Other Ambulatory Visit: Payer: Self-pay

## 2023-03-15 ENCOUNTER — Encounter (HOSPITAL_COMMUNITY)
Admission: RE | Admit: 2023-03-15 | Discharge: 2023-03-15 | Disposition: A | Discharge status: Home / Self Care | Payer: Medicare Other | Source: Ambulatory Visit | Attending: Internal Medicine | Admitting: Internal Medicine

## 2023-03-16 DIAGNOSIS — M961 Postlaminectomy syndrome, not elsewhere classified: Secondary | ICD-10-CM | POA: Diagnosis not present

## 2023-03-19 ENCOUNTER — Ambulatory Visit (HOSPITAL_COMMUNITY)
Admission: RE | Admit: 2023-03-19 | Discharge: 2023-03-19 | Disposition: A | Discharge status: Home / Self Care | Payer: Medicare Other | Attending: Internal Medicine | Admitting: Internal Medicine

## 2023-03-19 ENCOUNTER — Encounter (HOSPITAL_COMMUNITY)
Admission: RE | Disposition: A | Discharge status: Home / Self Care | Payer: Self-pay | Source: Home / Self Care | Attending: Internal Medicine

## 2023-03-19 ENCOUNTER — Ambulatory Visit (HOSPITAL_COMMUNITY): Payer: Medicare Other | Admitting: Anesthesiology

## 2023-03-19 ENCOUNTER — Encounter (HOSPITAL_COMMUNITY): Payer: Self-pay

## 2023-03-19 DIAGNOSIS — K5730 Diverticulosis of large intestine without perforation or abscess without bleeding: Secondary | ICD-10-CM | POA: Insufficient documentation

## 2023-03-19 DIAGNOSIS — D509 Iron deficiency anemia, unspecified: Secondary | ICD-10-CM

## 2023-03-19 DIAGNOSIS — K319 Disease of stomach and duodenum, unspecified: Secondary | ICD-10-CM | POA: Diagnosis not present

## 2023-03-19 DIAGNOSIS — K2950 Unspecified chronic gastritis without bleeding: Secondary | ICD-10-CM | POA: Insufficient documentation

## 2023-03-19 DIAGNOSIS — R12 Heartburn: Secondary | ICD-10-CM

## 2023-03-19 DIAGNOSIS — K317 Polyp of stomach and duodenum: Secondary | ICD-10-CM | POA: Insufficient documentation

## 2023-03-19 DIAGNOSIS — K219 Gastro-esophageal reflux disease without esophagitis: Secondary | ICD-10-CM | POA: Insufficient documentation

## 2023-03-19 DIAGNOSIS — Z87891 Personal history of nicotine dependence: Secondary | ICD-10-CM | POA: Diagnosis not present

## 2023-03-19 DIAGNOSIS — K3189 Other diseases of stomach and duodenum: Secondary | ICD-10-CM | POA: Diagnosis not present

## 2023-03-19 DIAGNOSIS — K2970 Gastritis, unspecified, without bleeding: Secondary | ICD-10-CM | POA: Diagnosis not present

## 2023-03-19 DIAGNOSIS — R1310 Dysphagia, unspecified: Secondary | ICD-10-CM | POA: Diagnosis not present

## 2023-03-19 LAB — GLUCOSE, CAPILLARY: Glucose-Capillary: 96 mg/dL (ref 70–99)

## 2023-03-19 SURGERY — COLONOSCOPY WITH PROPOFOL
Anesthesia: General

## 2023-03-19 MED ORDER — LACTATED RINGERS IV SOLN: INTRAVENOUS | Status: DC | PRN

## 2023-03-19 MED ORDER — LIDOCAINE HCL (CARDIAC) PF 100 MG/5ML IV SOSY
PREFILLED_SYRINGE | INTRAVENOUS | Status: DC | PRN
  Administered 2023-03-19: 100 mg via INTRATRACHEAL

## 2023-03-19 MED ORDER — PROPOFOL 500 MG/50ML IV EMUL
INTRAVENOUS | Status: DC | PRN
  Administered 2023-03-19: 100 ug/kg/min via INTRAVENOUS
  Administered 2023-03-19: 50 mg via INTRAVENOUS
  Administered 2023-03-19: 70 mg via INTRAVENOUS

## 2023-03-19 MED ORDER — STERILE WATER FOR IRRIGATION IR SOLN
Status: DC | PRN
  Administered 2023-03-19: 120 mL

## 2023-03-19 MED ORDER — EPHEDRINE SULFATE-NACL 50-0.9 MG/10ML-% IV SOSY
PREFILLED_SYRINGE | INTRAVENOUS | Status: DC | PRN
  Administered 2023-03-19: 10 mg via INTRAVENOUS

## 2023-03-19 NOTE — Op Note (Signed)
[facility] Patient Name: Brian Doyle Procedure Date: 03/19/2023 9:43 AM MRN: [id number] Date of Birth: November 22, 1960 Attending MD: Hennie Duos. Marletta Lor , [place], [id number] CSN: [id number] Age: 62 Admit Type: Outpatient Procedure:                Upper GI endoscopy Indications:              Dysphagia, Heartburn Providers:                Hennie Duos. Marletta Lor, DO, Francoise Ceo RN, RN, Lennice Sites Technician, Technician Referring MD:              Medicines:                See the Anesthesia note for documentation of the                            administered medications Complications:            No immediate complications. Estimated Blood Loss:     Estimated blood loss was minimal. Procedure:                Pre-Anesthesia Assessment:                           - The anesthesia plan was to use monitored                            anesthesia care (MAC).                           After obtaining informed consent, the endoscope was                            passed under direct vision. Throughout the                            procedure, the patient's blood pressure, pulse, and                            oxygen saturations were monitored continuously. The                            [id number]-H190 ([id number]) scope was introduced through the                            mouth, and advanced to the second part of duodenum.                            The upper GI endoscopy was accomplished without                            difficulty. The patient tolerated the procedure                            well. Scope In:  9:54:42 AM Scope Out: 9:58:55 AM Total Procedure Duration: 0 hours 4 minutes 13 seconds  Findings:      The Z-line was regular.      No endoscopic abnormality was evident in the esophagus to explain the       patient's complaint of dysphagia. Preparations were made for empiric       dilation. A TTS dilator was passed through the scope. Dilation with an       18-19-20 mm  balloon dilator was performed to 20 mm. Dilation was       performed with a mild resistance at 20 mm. Estimated blood loss was none.      Localized mild inflammation characterized by erosions and erythema was       found in the gastric antrum. Biopsies were taken with a cold forceps for       Helicobacter pylori testing.      Multiple small fundic gland polyps with no bleeding and no stigmata of       recent bleeding were found in the gastric fundus and in the gastric body.      The duodenal bulb, first portion of the duodenum and second portion of       the duodenum were normal. Impression:               - Z-line regular.                           - Gastritis. Biopsied.                           - Multiple gastric polyps.                           - Normal duodenal bulb, first portion of the                            duodenum and second portion of the duodenum. Moderate Sedation:      Per Anesthesia Care Recommendation:           - Patient has a contact number available for                            emergencies. The signs and symptoms of potential                            delayed complications were discussed with the                            patient. Return to normal activities tomorrow.                            Written discharge instructions were provided to the                            patient.                           - Resume previous diet.                           -  Continue present medications.                           - Await pathology results.                           - Repeat upper endoscopy PRN for retreatment.                           - Return to GI clinic in 3 months. Procedure Code(s):        --- Professional ---                           [phone], Esophagogastroduodenoscopy, flexible,                            transoral; with biopsy, single or multiple Diagnosis Code(s):        --- Professional ---                           K29.70, Gastritis, unspecified,  without bleeding                           K31.7, Polyp of stomach and duodenum                           R13.10, Dysphagia, unspecified                           R12, Heartburn CPT copyright 2022 American Medical Association. All rights reserved. The codes documented in this report are preliminary and upon coder review may  be revised to meet current compliance requirements. Hennie Duos. Marletta Lor, DO Hennie Duos. Marletta Lor, DO 03/19/2023 10:02:03 AM This report has been signed electronically. Number of Addenda: 0

## 2023-03-19 NOTE — Op Note (Signed)
[facility] Patient Name: Brian Doyle Procedure Date: 03/19/2023 9:43 AM MRN: [id number] Date of Birth: 1960-10-24 Attending MD: Hennie Duos. Maple Mirza, [id number] CSN: [id number] Age: 62 Admit Type: Outpatient Procedure:                Colonoscopy Indications:              Iron deficiency anemia Providers:                Hennie Duos. Marletta Lor, DO, Francoise Ceo RN, RN, Lennice Sites Technician, Technician Referring MD:              Medicines:                See the Anesthesia note for documentation of the                            administered medications Complications:            No immediate complications. Estimated Blood Loss:     Estimated blood loss: none. Procedure:                Pre-Anesthesia Assessment:                           - The anesthesia plan was to use monitored                            anesthesia care (MAC).                           After obtaining informed consent, the colonoscope                            was passed under direct vision. Throughout the                            procedure, the patient's blood pressure, pulse, and                            oxygen saturations were monitored continuously. The                            [id number]-HQ190L ([id number]) scope was introduced through                            the anus and advanced to the the cecum, identified                            by appendiceal orifice and ileocecal valve. The                            colonoscopy was performed without difficulty. The                            patient tolerated the procedure well. The  quality                            of the bowel preparation was evaluated using the                            BBPS [facility] Bowel Preparation Scale) with scores                            of: Right Colon = 3, Transverse Colon = 3 and Left                            Colon = 3 (entire mucosa seen well with no residual                            staining, small  fragments of stool or opaque                            liquid). The total BBPS score equals 9. Scope In: 10:04:21 AM Scope Out: 10:15:24 AM Scope Withdrawal Time: 0 hours 8 minutes 20 seconds  Total Procedure Duration: 0 hours 11 minutes 3 seconds  Findings:      Multiple small-mouthed diverticula were found in the sigmoid colon.      The exam was otherwise without abnormality. Impression:               - Diverticulosis in the sigmoid colon.                           - The examination was otherwise normal.                           - No specimens collected. Moderate Sedation:      Per Anesthesia Care Recommendation:           - Patient has a contact number available for                            emergencies. The signs and symptoms of potential                            delayed complications were discussed with the                            patient. Return to normal activities tomorrow.                            Written discharge instructions were provided to the                            patient.                           - Resume previous diet.                           - Continue  present medications.                           - Repeat colonoscopy in 10 years for screening                            purposes.                           - Return to GI clinic in 3 months. Procedure Code(s):        --- Professional ---                           [phone], Colonoscopy, flexible; diagnostic, including                            collection of specimen(s) by brushing or washing,                            when performed (separate procedure) Diagnosis Code(s):        --- Professional ---                           D50.9, Iron deficiency anemia, unspecified                           K57.30, Diverticulosis of large intestine without                            perforation or abscess without bleeding CPT copyright 2022 American Medical Association. All rights reserved. The codes documented in  this report are preliminary and upon coder review may  be revised to meet current compliance requirements. Hennie Duos. Marletta Lor, DO Hennie Duos. Marletta Lor, DO 03/19/2023 10:17:07 AM This report has been signed electronically. Number of Addenda: 0

## 2023-03-19 NOTE — Transfer of Care (Signed)
Immediate Anesthesia Transfer of Care Note  Patient: Jemari Hoard [place]wall  Procedure(s) Performed: COLONOSCOPY WITH PROPOFOL ESOPHAGOGASTRODUODENOSCOPY (EGD) WITH PROPOFOL ESOPHAGEAL DILATION BIOPSY  Patient Location: PACU  Anesthesia Type:General  Level of Consciousness: awake, alert , and oriented  Airway & Oxygen Therapy: Patient Spontanous Breathing  Post-op Assessment: Report given to RN and Post -op Vital signs reviewed and stable  Post vital signs: Reviewed and stable  Last Vitals:  Vitals Value Taken Time  BP 90/51 03/19/23 1019  Temp 36.6 C 03/19/23 1019  Pulse 74 03/19/23 1019  Resp 14 03/19/23 1019  SpO2 97 % 03/19/23 1019    Last Pain:  Vitals:   03/19/23 1019  TempSrc: Oral  PainSc: 0-No pain         Complications: No notable events documented.

## 2023-03-19 NOTE — H&P (Signed)
Primary Care Physician:  Richardean Chimera, MD Primary Gastroenterologist:  Dr. Marletta Lor  Pre-Procedure History & Physical: HPI:  Brian Doyle is a 62 y.o. male is here for an EGD with possible dilation due to history of dysphagia, GERD and colonoscopy for iron deficiency anemia.   Past Medical History:  Diagnosis Date   Chronic lower back pain    GERD (gastroesophageal reflux disease)    Hiatal hernia    HLD (hyperlipidemia)    Hypertriglyceridemia    Neuropathy    Pre-diabetes     Past Surgical History:  Procedure Laterality Date   BIOPSY  01/14/2019   Procedure: BIOPSY;  Surgeon: West Bali, MD;  [facility];  Service: Endoscopy;;  gastric esophagus     COLONOSCOPY  10/16/2013   [facility] with Dr. Catalina Gravel; moderate sedation; normal colonoscopy.   ELBOW SURGERY Left    ELBOW SURGERY Right    2023   ESOPHAGOGASTRODUODENOSCOPY     ESOPHAGOGASTRODUODENOSCOPY (EGD) WITH PROPOFOL N/A 01/14/2019   Procedure: ESOPHAGOGASTRODUODENOSCOPY (EGD) WITH PROPOFOL;  Surgeon: West Bali, MD;  [facility];  Service: Endoscopy;  Laterality: N/A;  2:45pm   lower back surgery     x 2 (March 2010; Feb 2011)   POLYPECTOMY  01/14/2019   Procedure: POLYPECTOMY;  Surgeon: West Bali, MD;  [facility];  Service: Endoscopy;;  gastric   SAVORY DILATION N/A 01/14/2019   Procedure: SAVORY DILATION;  Surgeon: West Bali, MD;  [facility];  Service: Endoscopy;  Laterality: N/A;   SHOULDER SURGERY Left    SHOULDER SURGERY Right    2023   TRIGGER FINGER RELEASE Left 09/07/2022   Procedure: LEFT LONG FINGER A-1 PULLEY RELEASE;  Surgeon: Betha Loa, MD;  Location: [facility];  Service: Orthopedics;  Laterality: Left;  45 MIN   vasectomy      Prior to Admission medications   Medication Sig Start Date End Date Taking? Authorizing Provider  gabapentin (NEURONTIN) 300 MG capsule Take 900 mg by mouth 3  (three) times daily.   Yes [provider]  HYDROcodone-acetaminophen (NORCO/VICODIN) 5-325 MG tablet Take 1 tablet by mouth 3 (three) times daily.   Yes [provider]  Multiple Vitamin (MULTIVITAMIN) capsule Take 1 capsule by mouth daily.   Yes [provider]  omeprazole (PRILOSEC) 20 MG capsule Take 20 mg by mouth 2 (two) times daily before a meal.   Yes [provider]  rosuvastatin (CRESTOR) 5 MG tablet Take 10 mg by mouth daily.   Yes [provider]  tadalafil (CIALIS) 20 MG tablet Take 1 tablet (20 mg total) by mouth daily as needed for erectile dysfunction. 09/23/20   Bjorn Pippin, MD  tamsulosin (FLOMAX) 0.4 MG CAPS capsule Take 1 capsule (0.4 mg total) by mouth daily. Patient not taking: Reported on 02/08/2023 09/23/20   Bjorn Pippin, MD    Allergies as of 02/08/2023 - Review Complete 02/08/2023  Allergen Reaction Noted   Ketamine hcl  01/07/2019   Morphine and codeine  12/09/2018   Nsaids  12/09/2018    Family History  Problem Relation Age of Onset   Lung cancer Mother    Colon cancer Neg Hx    Colon polyps Neg Hx     Social History   Socioeconomic History   Marital status: Married    Spouse name: Not on file   Number of children: Not on file   Years of education: Not  on file   Highest education level: Not on file  Occupational History   Occupation: retired  Tobacco Use   Smoking status: Former    Types: Cigarettes   Smokeless tobacco: Current    Types: Engineer, drilling   Vaping status: Never Used  Substance and Sexual Activity   Alcohol use: Yes    Comment: occas   Drug use: Never   Sexual activity: Yes  Other Topics Concern   Not on file  Social History Narrative   Not on file   Social Drivers of Health   Financial Resource Strain: Not on file  Food Insecurity: Not on file  Transportation Needs: Not on file  Physical Activity: Not on file  Stress: Not on file  Social Connections: Not on file  Intimate  Partner Violence: Not on file    Review of Systems: General: Negative for fever, chills, fatigue, weakness. Eyes: Negative for vision changes.  ENT: Negative for hoarseness, difficulty swallowing , nasal congestion. CV: Negative for chest pain, angina, palpitations, dyspnea on exertion, peripheral edema.  Respiratory: Negative for dyspnea at rest, dyspnea on exertion, cough, sputum, wheezing.  GI: See history of present illness. GU:  Negative for dysuria, hematuria, urinary incontinence, urinary frequency, nocturnal urination.  MS: Negative for joint pain, low back pain.  Derm: Negative for rash or itching.  Neuro: Negative for weakness, abnormal sensation, seizure, frequent headaches, memory loss, confusion.  Psych: Negative for anxiety, depression Endo: Negative for unusual weight change.  Heme: Negative for bruising or bleeding. Allergy: Negative for rash or hives.  Physical Exam: Vital signs in last 24 hours: Temp:  [98.3 F (36.8 C)] 98.3 F (36.8 C) (12/23 0837) Pulse Rate:  [57] 57 (12/23 0837) Resp:  [20] 20 (12/23 0837) BP: (131)/(82) 131/82 (12/23 0837) SpO2:  [18 %] 18 % (12/23 0837) Weight:  [111.7 kg] 111.7 kg (12/23 0837)   General:   Alert,  Well-developed, well-nourished, pleasant and cooperative in NAD Head:  Normocephalic and atraumatic. Eyes:  Sclera clear, no icterus.   Conjunctiva pink. Ears:  Normal auditory acuity. Nose:  No deformity, discharge,  or lesions. Msk:  Symmetrical without gross deformities. Normal posture. Extremities:  Without clubbing or edema. Neurologic:  Alert and  oriented x4;  grossly normal neurologically. Skin:  Intact without significant lesions or rashes. Psych:  Alert and cooperative. Normal mood and affect.   Impression/Plan: Brian Doyle is here for an EGD with possible dilation due to history of dysphagia, GERD and colonoscopy for iron deficiency anemia.   Risks, benefits, limitations, imponderables and alternatives  regarding procedure have been reviewed with the patient. Questions have been answered. All parties agreeable.

## 2023-03-19 NOTE — Discharge Instructions (Signed)
EGD Discharge instructions Please read the instructions outlined below and refer to this sheet in the next few weeks. These discharge instructions provide you with general information on caring for yourself after you leave the hospital. Your doctor may also give you specific instructions. While your treatment has been planned according to the most current medical practices available, unavoidable complications occasionally occur. If you have any problems or questions after discharge, please call your doctor. ACTIVITY You may resume your regular activity but move at a slower pace for the next 24 hours.  Take frequent rest periods for the next 24 hours.  Walking will help expel (get rid of) the air and reduce the bloated feeling in your abdomen.  No driving for 24 hours (because of the anesthesia (medicine) used during the test).  You may shower.  Do not sign any important legal documents or operate any machinery for 24 hours (because of the anesthesia used during the test).  NUTRITION Drink plenty of fluids.  You may resume your normal diet.  Begin with a light meal and progress to your normal diet.  Avoid alcoholic beverages for 24 hours or as instructed by your caregiver.  MEDICATIONS You may resume your normal medications unless your caregiver tells you otherwise.  WHAT YOU CAN EXPECT TODAY You may experience abdominal discomfort such as a feeling of fullness or "gas" pains.  FOLLOW-UP Your doctor will discuss the results of your test with you.  SEEK IMMEDIATE MEDICAL ATTENTION IF ANY OF THE FOLLOWING OCCUR: Excessive nausea (feeling sick to your stomach) and/or vomiting.  Severe abdominal pain and distention (swelling).  Trouble swallowing.  Temperature over 101 F (37.8 C).  Rectal bleeding or vomiting of blood.     Colonoscopy Discharge Instructions  Read the instructions outlined below and refer to this sheet in the next few weeks. These discharge instructions provide you with  general information on caring for yourself after you leave the hospital. Your doctor may also give you specific instructions. While your treatment has been planned according to the most current medical practices available, unavoidable complications occasionally occur.   ACTIVITY You may resume your regular activity, but move at a slower pace for the next 24 hours.  Take frequent rest periods for the next 24 hours.  Walking will help get rid of the air and reduce the bloated feeling in your belly (abdomen).  No driving for 24 hours (because of the medicine (anesthesia) used during the test).   Do not sign any important legal documents or operate any machinery for 24 hours (because of the anesthesia used during the test).  NUTRITION Drink plenty of fluids.  You may resume your normal diet as instructed by your doctor.  Begin with a light meal and progress to your normal diet. Heavy or fried foods are harder to digest and may make you feel sick to your stomach (nauseated).  Avoid alcoholic beverages for 24 hours or as instructed.  MEDICATIONS You may resume your normal medications unless your doctor tells you otherwise.  WHAT YOU CAN EXPECT TODAY Some feelings of bloating in the abdomen.  Passage of more gas than usual.  Spotting of blood in your stool or on the toilet paper.  IF YOU HAD POLYPS REMOVED DURING THE COLONOSCOPY: No aspirin products for 7 days or as instructed.  No alcohol for 7 days or as instructed.  Eat a soft diet for the next 24 hours.  FINDING OUT THE RESULTS OF YOUR TEST Not all test results are  available during your visit. If your test results are not back during the visit, make an appointment with your caregiver to find out the results. Do not assume everything is normal if you have not heard from your caregiver or the medical facility. It is important for you to follow up on all of your test results.  SEEK IMMEDIATE MEDICAL ATTENTION IF: You have more than a spotting of  blood in your stool.  Your belly is swollen (abdominal distention).  You are nauseated or vomiting.  You have a temperature over 101.  You have abdominal pain or discomfort that is severe or gets worse throughout the day.   Your EGD revealed mild amount inflammation in your stomach.  I took biopsies of this to rule out infection with a bacteria called H. pylori.  Await pathology results, my office will contact you.  I stretched your esophagus today.  Hopefully this helps with feeling of food getting stuck.  Small bowel appeared normal.  Your colonoscopy was relatively unremarkable.  I did not find any polyps or evidence of colon cancer.  I recommend repeating colonoscopy in 10 years for colon cancer screening purposes.    You do have mild diverticulosis. I would recommend increasing fiber in your diet.. Follow-up with GI in 3 months   I hope you have a great rest of your week!  Hennie Duos. Marletta Lor, D.O. Gastroenterology and Hepatology [facility]

## 2023-03-19 NOTE — Anesthesia Preprocedure Evaluation (Signed)

## 2023-03-20 LAB — SURGICAL PATHOLOGY

## 2023-03-20 NOTE — Anesthesia Postprocedure Evaluation (Signed)
Anesthesia Post Note  Patient: Dameir E Sandler  Procedure(s) Performed: COLONOSCOPY WITH PROPOFOL ESOPHAGOGASTRODUODENOSCOPY (EGD) WITH PROPOFOL ESOPHAGEAL DILATION BIOPSY  Patient location during evaluation: Phase II Anesthesia Type: General Level of consciousness: awake Pain management: pain level controlled Vital Signs Assessment: post-procedure vital signs reviewed and stable Respiratory status: spontaneous breathing and respiratory function stable Cardiovascular status: blood pressure returned to baseline and stable Postop Assessment: no headache and no apparent nausea or vomiting Anesthetic complications: no Comments: Late entry   No notable events documented.   Last Vitals:  Vitals:   03/19/23 0837 03/19/23 1019  BP: 131/82 106/74  Pulse: (!) 57 74  Resp: 20 14  Temp: 36.8 C 36.6 C  SpO2: (!) 18% 97%    Last Pain:  Vitals:   03/19/23 1019  TempSrc: Oral  PainSc: 0-No pain                 Windell Norfolk

## 2023-04-10 NOTE — Progress Notes (Signed)
 error

## 2023-04-24 DIAGNOSIS — J0101 Acute recurrent maxillary sinusitis: Secondary | ICD-10-CM | POA: Diagnosis not present

## 2023-04-24 DIAGNOSIS — R051 Acute cough: Secondary | ICD-10-CM | POA: Diagnosis not present

## 2023-04-24 DIAGNOSIS — R7303 Prediabetes: Secondary | ICD-10-CM | POA: Diagnosis not present

## 2023-04-24 DIAGNOSIS — K219 Gastro-esophageal reflux disease without esophagitis: Secondary | ICD-10-CM | POA: Diagnosis not present

## 2023-04-24 DIAGNOSIS — D649 Anemia, unspecified: Secondary | ICD-10-CM | POA: Diagnosis not present

## 2023-05-10 ENCOUNTER — Encounter: Payer: Self-pay | Admitting: Gastroenterology

## 2023-05-11 DIAGNOSIS — J40 Bronchitis, not specified as acute or chronic: Secondary | ICD-10-CM | POA: Diagnosis not present

## 2023-05-15 DIAGNOSIS — D649 Anemia, unspecified: Secondary | ICD-10-CM | POA: Diagnosis not present

## 2023-05-15 DIAGNOSIS — R7303 Prediabetes: Secondary | ICD-10-CM | POA: Diagnosis not present

## 2023-05-15 DIAGNOSIS — E7800 Pure hypercholesterolemia, unspecified: Secondary | ICD-10-CM | POA: Diagnosis not present

## 2023-05-15 DIAGNOSIS — E559 Vitamin D deficiency, unspecified: Secondary | ICD-10-CM | POA: Diagnosis not present

## 2023-05-15 DIAGNOSIS — J40 Bronchitis, not specified as acute or chronic: Secondary | ICD-10-CM | POA: Diagnosis not present

## 2023-05-28 DIAGNOSIS — R058 Other specified cough: Secondary | ICD-10-CM | POA: Diagnosis not present

## 2023-05-28 DIAGNOSIS — R051 Acute cough: Secondary | ICD-10-CM | POA: Diagnosis not present

## 2023-05-28 DIAGNOSIS — U071 COVID-19: Secondary | ICD-10-CM | POA: Diagnosis not present

## 2023-06-01 DIAGNOSIS — Z1329 Encounter for screening for other suspected endocrine disorder: Secondary | ICD-10-CM | POA: Diagnosis not present

## 2023-06-14 DIAGNOSIS — M961 Postlaminectomy syndrome, not elsewhere classified: Secondary | ICD-10-CM | POA: Diagnosis not present

## 2023-07-10 DIAGNOSIS — M961 Postlaminectomy syndrome, not elsewhere classified: Secondary | ICD-10-CM | POA: Diagnosis not present

## 2023-07-24 DIAGNOSIS — M25522 Pain in left elbow: Secondary | ICD-10-CM | POA: Diagnosis not present

## 2023-09-04 DIAGNOSIS — M5416 Radiculopathy, lumbar region: Secondary | ICD-10-CM | POA: Diagnosis not present

## 2023-10-05 DIAGNOSIS — M5416 Radiculopathy, lumbar region: Secondary | ICD-10-CM | POA: Diagnosis not present

## 2023-11-09 DIAGNOSIS — H903 Sensorineural hearing loss, bilateral: Secondary | ICD-10-CM | POA: Diagnosis not present

## 2023-12-12 DIAGNOSIS — M5416 Radiculopathy, lumbar region: Secondary | ICD-10-CM | POA: Diagnosis not present
